# Patient Record
Sex: Female | Born: 1980 | Race: White | Hispanic: Yes | Marital: Married | Smoking: Never smoker
Health system: Southern US, Community
[De-identification: ages and names within clinical notes are randomized; demographics above are authoritative.]

---

## 2006-06-19 ENCOUNTER — Emergency Department: Payer: Self-pay | Admitting: Emergency Medicine

## 2006-06-19 ENCOUNTER — Other Ambulatory Visit: Payer: Self-pay

## 2009-06-24 ENCOUNTER — Emergency Department: Payer: Self-pay | Admitting: Emergency Medicine

## 2009-06-27 ENCOUNTER — Emergency Department: Payer: Self-pay | Admitting: Emergency Medicine

## 2011-04-21 ENCOUNTER — Emergency Department: Payer: Self-pay | Admitting: Emergency Medicine

## 2011-11-21 ENCOUNTER — Ambulatory Visit: Payer: Self-pay | Admitting: Family Medicine

## 2012-02-01 ENCOUNTER — Observation Stay: Payer: Self-pay | Admitting: Obstetrics and Gynecology

## 2012-02-01 LAB — URINALYSIS, COMPLETE
Bilirubin,UR: NEGATIVE
Glucose,UR: NEGATIVE mg/dL (ref 0–75)
Nitrite: NEGATIVE
Ph: 7 (ref 4.5–8.0)
Protein: NEGATIVE
RBC,UR: <1 /HPF (ref 0–5)
Squamous Epithelial: NONE SEEN
WBC UR: 4 /HPF (ref 0–5)

## 2012-02-01 LAB — PREGNANCY, URINE: Pregnancy Test, Urine: POSITIVE m[IU]/mL

## 2012-05-17 ENCOUNTER — Inpatient Hospital Stay: Payer: Self-pay | Admitting: Obstetrics and Gynecology

## 2012-05-17 LAB — CBC WITH DIFFERENTIAL/PLATELET
Eosinophil #: 0.1 10*3/uL (ref 0.0–0.7)
Eosinophil %: 1.2 %
HCT: 36.7 % (ref 35.0–47.0)
Lymphocyte #: 2.2 10*3/uL (ref 1.0–3.6)
Lymphocyte %: 28.7 %
MCHC: 34.1 g/dL (ref 32.0–36.0)
MCV: 89 fL (ref 80–100)
Monocyte %: 8.2 %
Neutrophil #: 4.6 10*3/uL (ref 1.4–6.5)
Neutrophil %: 60.5 %
RBC: 4.12 10*6/uL (ref 3.80–5.20)
WBC: 7.6 10*3/uL (ref 3.6–11.0)

## 2014-06-02 ENCOUNTER — Ambulatory Visit: Payer: Self-pay | Admitting: Advanced Practice Midwife

## 2014-07-07 ENCOUNTER — Ambulatory Visit: Payer: Self-pay | Admitting: Physician Assistant

## 2014-09-21 ENCOUNTER — Observation Stay: Payer: Self-pay | Admitting: Obstetrics and Gynecology

## 2014-11-16 ENCOUNTER — Observation Stay: Payer: Self-pay | Admitting: Obstetrics and Gynecology

## 2014-11-21 ENCOUNTER — Inpatient Hospital Stay: Payer: Self-pay | Admitting: Obstetrics and Gynecology

## 2015-02-13 NOTE — H&P (Signed)
L&D Evaluation:  History:   HPI 34 y/o G3P2002 @ 40/3wks EDC 05/13/12 arrives with c/o strong contractions, denies leaking fluid or vaginal bleeding. Baby moves well. PNC at [facility] (false + HIV repeat negative) GBS+    Presents with contractions    Patient's Medical History No Chronic Illness    Patient's Surgical History none    Medications Pre Natal Vitamins    Allergies NKDA    Social History none    Family History Non-Contributory   ROS:   ROS All systems were reviewed.  HEENT, CNS, GI, GU, Respiratory, CV, Renal and Musculoskeletal systems were found to be normal.   Exam:   Vital Signs stable    Urine Protein not completed    General no apparent distress    Mental Status clear    Chest clear    Heart normal sinus rhythm    Abdomen gravid, non-tender    Estimated Fetal Weight Average for gestational age    Fetal Position vtx    Fundal Height term    Back no CVAT    Edema no edema    Reflexes 1+    Clonus negative    Pelvic no external lesions, 5cm upon admission 7cm 100% vtx @ -1 BOWI sm show    Mebranes Intact    FHT normal rate with no decels, baseline 130's 140's avg variability with accels    Fetal Heart Rate 134    Ucx regular, q 3 mins 60 sec strong    Skin dry    Lymph no lymphadenopathy   Impression:   Impression active labor   Plan:   Plan monitor contractions and for cervical change, antibiotics for GBBS prophylaxis    Comments Breathing well thru uc's. Declines pain meds, IV ABX begun. Knows what to expect 3rd baby. Family supportive, at bedside.   Electronic Signatures: Albertina ParrLugiano, Jailene Cupit B (CNM)  (Signed 12-Aug-13 02:59)  Authored: L&D Evaluation   Last Updated: 12-Aug-13 02:59 by Albertina ParrLugiano, Ludie Pavlik B (CNM)

## 2015-03-08 ENCOUNTER — Encounter: Payer: Self-pay | Admitting: Emergency Medicine

## 2015-03-08 ENCOUNTER — Emergency Department
Admission: EM | Admit: 2015-03-08 | Discharge: 2015-03-08 | Disposition: A | Discharge status: Home / Self Care | Payer: Self-pay | Attending: Emergency Medicine | Admitting: Emergency Medicine

## 2015-03-08 DIAGNOSIS — G43009 Migraine without aura, not intractable, without status migrainosus: Secondary | ICD-10-CM | POA: Insufficient documentation

## 2015-03-08 LAB — BASIC METABOLIC PANEL
Anion gap: 6 (ref 5–15)
BUN: 9 mg/dL (ref 6–20)
CHLORIDE: 109 mmol/L (ref 101–111)
CO2: 25 mmol/L (ref 22–32)
CREATININE: 0.65 mg/dL (ref 0.44–1.00)
Calcium: 8.6 mg/dL — ABNORMAL LOW (ref 8.9–10.3)
GFR calc non Af Amer: >60 mL/min (ref >60)
Glucose, Bld: 91 mg/dL (ref 65–99)
Potassium: 3.8 mmol/L (ref 3.5–5.1)
Sodium: 140 mmol/L (ref 135–145)

## 2015-03-08 LAB — CBC WITH DIFFERENTIAL/PLATELET
Basophils Absolute: 0.1 10*3/uL (ref 0–0.1)
Basophils Relative: 1 %
Eosinophils Absolute: 0.2 10*3/uL (ref 0–0.7)
Eosinophils Relative: 2 %
HCT: 38.9 % (ref 35.0–47.0)
Hemoglobin: 12.7 g/dL (ref 12.0–16.0)
Lymphocytes Relative: 26 %
Lymphs Abs: 1.9 10*3/uL (ref 1.0–3.6)
MCH: 29 pg (ref 26.0–34.0)
MCHC: 32.5 g/dL (ref 32.0–36.0)
MCV: 89.1 fL (ref 80.0–100.0)
Monocytes Absolute: 0.5 10*3/uL (ref 0.2–0.9)
Monocytes Relative: 7 %
Neutro Abs: 4.6 10*3/uL (ref 1.4–6.5)
Neutrophils Relative %: 64 %
PLATELETS: 218 10*3/uL (ref 150–440)
RBC: 4.37 MIL/uL (ref 3.80–5.20)
RDW: 13.2 % (ref 11.5–14.5)
WBC: 7.3 10*3/uL (ref 3.6–11.0)

## 2015-03-08 MED ORDER — BUTALBITAL-APAP-CAFFEINE 50-325-40 MG PO TABS
ORAL_TABLET | ORAL | Status: AC
  Administered 2015-03-08: 2 via ORAL
  Filled 2015-03-08: qty 2

## 2015-03-08 MED ORDER — BUTALBITAL-APAP-CAFFEINE 50-325-40 MG PO TABS
2.0000 | ORAL_TABLET | ORAL | Status: AC
  Administered 2015-03-08: 2 via ORAL

## 2015-03-08 NOTE — ED Notes (Signed)
C/o right sided head pain and numbness with nausea x 3 days, states she had the same numbness about 2 weeks ago that resolved, also states she has some dizziness, denies any hx of migraines

## 2015-03-08 NOTE — ED Notes (Signed)
Pt alert and oriented X4, active, cooperative, pt in NAD. RR even and unlabored, color WNL.  Pt informed to return if any life threatening symptoms occur.   

## 2015-03-08 NOTE — ED Notes (Signed)
R sided headache X 3 days, nausea. Pt alert and oriented X4, active, cooperative, pt in NAD. RR even and unlabored, color WNL.

## 2015-03-08 NOTE — ED Notes (Signed)
Awaiting interpreter for discharge.  

## 2015-03-08 NOTE — Discharge Instructions (Signed)
Cefalea migrañosa °(Migraine Headache) °Una cefalea migrañosa es un dolor intenso y punzante en uno o ambos lados de la cabeza. La migraña puede durar desde 30 minutos hasta varias horas. °CAUSAS  °No siempre se conoce la causa exacta de la cefalea migrañosa. Sin embargo, pueden aparecer cuando los nervios del cerebro se irritan y liberan ciertas sustancias químicas que causan inflamación. Esto ocasiona dolor. °Existen también ciertos factores que pueden desencadenar las migrañas, como los siguientes: °· Alcohol. °· Fumar. °· Estrés. °· La menstruación °· Quesos añejados. °· Los alimentos o las bebidas que contienen nitratos, glutamato, aspartamo o tiramina. °· Falta de sueño. °· Chocolate. °· Cafeína. °· Hambre. °· Actividad física extenuante. °· Fatiga. °· Medicamentos que se usan para tratar el dolor en el pecho (nitroglicerina), píldoras anticonceptivas, estrógeno y algunos medicamentos para la hipertensión arterial. °SIGNOS Y SÍNTOMAS °· Dolor en uno o ambos lados de la cabeza. °· Dolor pulsante o punzante. °· Dolor intenso que impide realizar las actividades diarias. °· Dolor que se agrava por cualquier actividad física. °· Náuseas, vómitos o ambos. °· Mareos. °· Dolor con la exposición a las luces brillantes, a los ruidos fuertes o la actividad. °· Sensibilidad general a las luces brillantes, a los ruidos fuertes o a los olores. °Antes de sufrir una migraña, puede recibir señales de advertencia (aura). Un aura puede incluir: °· Ver las luces intermitentes. °· Ver puntos brillantes, halos o líneas en zigzag. °· Tener una visión en túnel o visión borrosa. °· Sensación de entumecimiento u hormigueo. °· Dificultad para hablar °· Debilidad muscular. °DIAGNÓSTICO  °La cefalea migrañosa se diagnostica en función de lo siguiente: °· Síntomas. °· Examen físico. °· Una TC (tomografía computada) o resonancia magnética de la cabeza. Estas pruebas de diagnóstico por imagen no pueden diagnosticar las migrañas, pero pueden  ayudar a descartar otras causas de las cefaleas. °TRATAMIENTO °Le prescribirán medicamentos para aliviar el dolor y las náuseas. También podrán administrarse medicamentos para ayudar a prevenir las migrañas recurrentes.  °INSTRUCCIONES PARA EL CUIDADO EN EL HOGAR °· Sólo tome medicamentos de venta libre o recetados para calmar el dolor o el malestar, según las indicaciones de su médico. No se recomienda usar los opiáceos a largo plazo. °· Cuando tenga la migraña, acuéstese en un cuarto oscuro y tranquilo °· Lleve un registro diario para averiguar lo que puede provocar las cefaleas migrañosas. Por ejemplo, escriba: °¨ Lo que usted come y bebe. °¨ Cuánto tiempo duerme. °¨ Algún cambio en su dieta o en los medicamentos. °· Limite el consumo de bebidas alcohólicas. °· Si fuma, deje de hacerlo. °· Duerma entre 7 y 9 horas, o según las recomendaciones del médico. °· Limite el estrés. °· Mantenga las luces tenues si le molestan las luces brillantes y la migraña empeora. °SOLICITE ATENCIÓN MÉDICA DE INMEDIATO SI:  °· La migraña se hace cada vez más intensa. °· Tiene fiebre. °· Presenta rigidez en el cuello. °· Tiene pérdida de visión. °· Presenta debilidad muscular o pérdida del control muscular. °· Comienza a perder el equilibrio o tiene problemas para caminar. °· Sufre mareos o se desmaya. °· Tiene síntomas graves que son diferentes a los primeros síntomas. °ASEGÚRESE DE QUE:  °· Comprende estas instrucciones. °· Controlará su afección. °· Recibirá ayuda de inmediato si no mejora o si empeora. °Document Released: 09/22/2005 Document Revised: 07/13/2013 °ExitCare® Patient Information ©2015 Exit[employer]. This information is not intended to replace advice given to you by your health care provider. Make sure you discuss any questions you have with your   health care provider. ° ° °

## 2015-03-08 NOTE — ED Provider Notes (Signed)
[facility] Emergency Department Provider Note  ____________________________________________  Time seen: Approximately 4:18 PM  I have reviewed the triage vital signs and the nursing notes.   HISTORY  Chief Complaint Headache and Nausea  The patient is Spanish-speaking and I offered the use of a hospital interpreter but she prefers to speak Spanish struck with me.  Her daughter who is fluent in [place] was also present at the time.  HPI Leslie Sosa is a 34 y.o. female with no significant past medical history presents with 3 days of right-sided unilateral headache that she describes as throbbing and constant.  It is accompanied with nausea.  Nothing makes it better and nothing makes it worse.  She describes it as mild to moderate.  She does not have extensive symptoms in the past.  She has no numbness or weakness in any of her extremities.  She has not tried taking any over-the-counter medicine for it.  Of note she is currently breast-feeding.She tried to set up an appointment at [facility] but will be approximately 2 months before she can have that appointment.   History reviewed. No pertinent past medical history.  There are no active problems to display for this patient.   History reviewed. No pertinent past surgical history.  No current outpatient prescriptions on file.  Allergies Review of patient's allergies indicates no known allergies.  No family history on file.  Social History History  Substance Use Topics  . Smoking status: Never Smoker   . Smokeless tobacco: Not on file  . Alcohol Use: No    Review of Systems Constitutional: No fever/chills Eyes: No visual changes. ENT: No sore throat. Cardiovascular: Denies chest pain. Respiratory: Denies shortness of breath. Gastrointestinal: No abdominal pain.  nausea, no vomiting.  No diarrhea.  No constipation. Genitourinary: Negative for dysuria. Musculoskeletal: Negative  for back pain. Skin: Negative for rash. Neurological: Headache as described above with no focal weakness or numbness  10-point ROS otherwise negative.  ____________________________________________   PHYSICAL EXAM:  VITAL SIGNS: ED Triage Vitals  Enc Vitals Group     BP 03/08/15 1449 134/79 mmHg     Pulse Rate 03/08/15 1449 85     Resp 03/08/15 1449 18     Temp 03/08/15 1449 99.2 F (37.3 C)     Temp Source 03/08/15 1449 Oral     SpO2 03/08/15 1449 98 %     Weight 03/08/15 1449 175 lb (79.379 kg)     Height 03/08/15 1449  (1.626 m)     Head Cir --      Peak Flow --      Pain Score 03/08/15 1450 8     Pain Loc --      Pain Edu? --      Excl. in GC? --     Constitutional: Alert and oriented. Well appearing and in no acute distress. Eyes: Conjunctivae are normal. PERRL. EOMI. normal funduscopic exam. Head: Atraumatic. Nose: No congestion/rhinnorhea. Mouth/Throat: Mucous membranes are moist.  Oropharynx non-erythematous. Neck: No stridor.  No cervical spine tenderness to palpation. Cardiovascular: Normal rate, regular rhythm. Grossly normal heart sounds.  Good peripheral circulation. Respiratory: Normal respiratory effort.  No retractions. Lungs CTAB. Gastrointestinal: Soft and nontender. No distention. No abdominal bruits. No CVA tenderness. Musculoskeletal: No lower extremity tenderness nor edema.  No joint effusions. Neurologic:  Normal speech and language. No gross focal neurologic deficits are appreciated. Speech is normal. No gait instability. Skin:  Skin is warm, dry and  intact. No rash noted. Psychiatric: Mood and affect are normal. Speech and behavior are normal.  ____________________________________________   LABS (all labs ordered are listed, but only abnormal results are displayed)  Labs Reviewed  BASIC METABOLIC PANEL - Abnormal; Notable for the following:    Calcium 8.6 (*)    All other components within normal limits  CBC WITH DIFFERENTIAL/PLATELET    ____________________________________________  EKG  Not indicated ____________________________________________  RADIOLOGY  Not indicated  ____________________________________________  INITIAL IMPRESSION / ASSESSMENT AND PLAN / ED COURSE  Pertinent labs & imaging results that were available during my care of the patient were reviewed by me and considered in my medical decision making (see chart for details).  The patient is well-appearing and in no acute distress.  Her signs and symptoms are most consistent with a migraine.  I offered IV treatment with the usual IV migraine cocktail, but she and her family need to leave to go to her son's graduation.  She prefers a dose of medicine by pills.  I discussed the use of Fioricet with the pharmacist who stated it is a class C medication and that a single dose is unlikely to cause any issues while breast-feeding.  I will not prescribe any medication and instead recommended that she take over-the-counter Tylenol according to label instructions and will give her a one-time dose of Fioricet here.  I gave her and her daughter my usual and customary return precautions.  ____________________________________________  FINAL CLINICAL IMPRESSION(S) / ED DIAGNOSES  Final diagnoses:  Migraine without aura and without status migrainosus, not intractable      NEW MEDICATIONS STARTED DURING THIS VISIT:  New Prescriptions   No medications on file     Loleta Roseory Katalin Colledge, MD 03/08/15 1623

## 2015-03-08 NOTE — ED Notes (Signed)
Interpreter at bedside for discharge.

## 2015-03-13 ENCOUNTER — Emergency Department
Admission: EM | Admit: 2015-03-13 | Discharge: 2015-03-13 | Discharge status: Against Medical Advice | Payer: Self-pay | Attending: Emergency Medicine | Admitting: Emergency Medicine

## 2015-03-13 ENCOUNTER — Encounter: Payer: Self-pay | Admitting: *Deleted

## 2015-03-13 DIAGNOSIS — G43909 Migraine, unspecified, not intractable, without status migrainosus: Secondary | ICD-10-CM | POA: Insufficient documentation

## 2015-03-13 NOTE — ED Notes (Signed)
Pt was seen Friday for migraine, pt left before receiving medications, pt returns with migraine today

## 2015-03-16 ENCOUNTER — Telehealth: Payer: Self-pay | Admitting: Emergency Medicine

## 2015-03-16 NOTE — ED Notes (Signed)
Called patient due to lwot to inquire about condition and follow up plans. Left message with my number. 

## 2016-04-26 ENCOUNTER — Encounter: Payer: Self-pay | Admitting: Urgent Care

## 2016-04-26 ENCOUNTER — Emergency Department
Admission: EM | Admit: 2016-04-26 | Discharge: 2016-04-26 | Disposition: A | Discharge status: Home / Self Care | Payer: Medicaid Other | Attending: Emergency Medicine | Admitting: Emergency Medicine

## 2016-04-26 DIAGNOSIS — R609 Edema, unspecified: Secondary | ICD-10-CM | POA: Insufficient documentation

## 2016-04-26 DIAGNOSIS — T63441A Toxic effect of venom of bees, accidental (unintentional), initial encounter: Secondary | ICD-10-CM | POA: Insufficient documentation

## 2016-04-26 MED ORDER — PREDNISONE 10 MG (21) PO TBPK: ORAL_TABLET | ORAL | Status: AC

## 2016-04-26 MED ORDER — PREDNISONE 20 MG PO TABS
ORAL_TABLET | ORAL | Status: AC
  Administered 2016-04-26: 60 mg via ORAL
  Filled 2016-04-26: qty 3

## 2016-04-26 MED ORDER — PREDNISONE 20 MG PO TABS
60.0000 mg | ORAL_TABLET | Freq: Once | ORAL | Status: AC
  Administered 2016-04-26: 60 mg via ORAL

## 2016-04-26 MED ORDER — EPINEPHRINE 0.3 MG/0.3ML IJ SOAJ: 0.3000 mg | Freq: Once | INTRAMUSCULAR | Status: AC

## 2016-04-26 NOTE — ED Notes (Signed)
AAOx3.  Skin warm and dry.  NAD 

## 2016-04-26 NOTE — Discharge Instructions (Signed)
Picadura de [place], avispa o avispn ([place], [place], or [employer]) [place], las avispas y los avispones forman parte de una familia de insectos que pueden Engineer, agricultural a las Engineer, manufacturing. Estas picaduras causan dolor e inflamacin, pero por lo general no son graves. Sin embargo, Psychologist, clinical pueden tener una reaccin alrgica a Venda [place]odes. Esto puede agravar los sntomas.  SNTOMAS  Los sntomas frecuentes de este proceso [employer] siguientes:   Un bulto rojo en la piel que a veces tiene un Animal nutritionist. En algunos casos, puede haber un aguijn en el centro de la herida.  Dolor y Futures trader de la picadura.  Enrojecimiento e hinchazn alrededor del lugar de la picadura. Si tiene Nurse, mental health (reaccin alrgica localizada), la hinchazn y el enrojecimiento pueden extenderse ms all del lugar de la picadura. En algunos casos, esta reaccin puede seguir avanzando en las siguientes 12 a 36horas. En contadas ocasiones, una persona puede tener una reaccin alrgica grave (reaccin anafilctica) a Venda Rodes. Los sntomas de una reaccin anafilctica pueden incluir lo siguiente:   Dificultad para respirar o sibilancias.  Manchas rojas elevadas en la piel que causan picazn.  Nuseas o vmitos.  Clicos abdominales.  Diarrea.  Dolor en el pecho.  Desmayos.  Enrojecimiento del rostro (rubor). DIAGNSTICO  Este proceso suele diagnosticarse en funcin de los sntomas, la historia clnica y un examen fsico. TRATAMIENTO  La mayora de las picaduras pueden tratarse con lo siguiente:   Hielo para reducir la hinchazn.  Medicamentos (antihistamnicos) para tratar la picazn o Nurse, mental health.  Medicamentos para ayudar a Best boy. Estos pueden ser medicamentos que se toman por va oral, o cremas o lociones medicinales que se aplican en la piel. Si lo pic una abeja, el insecto puede depositar el aguijn y un pequeo saco de veneno en la herida.  El aguijn y el saco de veneno se pueden quitar si se raspa el lugar de la picadura con una tarjeta plana, por ejemplo, una tarjeta de crdito. Otro mtodo consiste en pellizcar la zona y Environmental manager. Estos mtodos pueden ayudar a Education officer, museum gravedad de la reaccin del cuerpo a la picadura.  [place] lugar de la picadura todos los das con agua y Manufacturing engineer se lo haya indicado el mdico.  Aplquese o tome los medicamentos de venta libre y los recetados solamente como se lo haya indicado el mdico.  Si se lo indican, aplique hielo sobre la zona de la picadura.  Ponga el hielo en una bolsa plstica.  Coloque una toalla entre la piel y la bolsa de hielo.  Coloque el hielo durante 81mnutos, 2 a 3veces por dTraining and development officer  No se rasque la zona de la picadura.  Para aliviar el dolor, pruebe con una pasta que se prepara con agua y bicarbonato de sEads aplquesela sobre la picadura. Frote la pasta sobre la zona de la picadura y djela durante 522mutos.  Si tuvo una reaccin alrgica grave a una picadura, tal vez tenga que hacer lo siguiente:  UtRisk managern brazalete o un collar de alerta mdico que indique la alBuyer, retail Saber cundo y cmo usar un kit de anafilaxia o una inyeccin de epinefrina. Los miUnitedHealthe su familia tambin deben teBest boyonocimientos al reSears Holdings Corporation Llevar con usted un kit de anafilaxia en todo momento. SOLICITE ATENCIN MDICA SI:   Los sntomas no mejoran en el trmino de 2 o 3das.  Tiene  enrojecimiento, hinchazn o dolor que se extienden ms all de la zona de la picadura.  Tiene fiebre. SOLICITE ATENCIN MDICA DE INMEDIATO SI:  Tiene sntomas de una reaccin alrgica grave. Estos incluyen los siguientes:   Dificultad para respirar o sibilancias.  Dolor en el pecho.  Sensacin de desvanecimiento o desmayos.  Manchas rojas elevadas en la piel que le causan picazn.  Nuseas o vmitos.  Clicos  abdominales.  Diarrea.   Esta informacin no tiene Marine scientist el consejo del mdico. Asegrese de hacerle al mdico cualquier pregunta que tenga.   Document Released: 09/22/2005 Document Revised: 06/13/2015 Elsevier Interactive Patient Education [employer].

## 2016-04-26 NOTE — ED Notes (Addendum)
Patient presents with c/o swelling to RIGHT hand s/p being stung by a bee. No respiratory involvement. Will need interpreter.

## 2016-04-26 NOTE — ED Notes (Signed)
Right hand swelling, stung by bee PTA, no respiratory distress

## 2016-04-26 NOTE — ED Provider Notes (Signed)
[facility] Emergency Department Provider Note  ____________________________________________  Time seen: Approximately 9:35 PM   I have reviewed the triage vital signs and the nursing notes.   HISTORY  Chief Complaint Insect Bite   HPI Leslie Sosa is a 35 y.o. female presents to the emergency department for evaluation of left hand swelling and itching. She states that last night she was stung by a bee. She has taken allergy medication without any relief. She has had a similar episode in the past.  History reviewed. No pertinent past medical history.  There are no active problems to display for this patient.   History reviewed. No pertinent past surgical history.  Current Outpatient Rx  Name  Route  Sig  Dispense  Refill  . EPINEPHrine 0.3 mg/0.3 mL IJ SOAJ injection   Intramuscular   Inject 0.3 mLs (0.3 mg total) into the muscle once.   1 Device   1   . predniSONE (STERAPRED UNI-PAK 21 TAB) 10 MG (21) TBPK tablet      Take 6 tablets on day 1 Take 5 tablets on day 2 Take 4 tablets on day 3 Take 3 tablets on day 4 Take 2 tablets on day 5 Take 1 tablet on day 6   21 tablet   0     Allergies Review of patient's allergies indicates no known allergies.  No family history on file.  Social History Social History  Substance Use Topics  . Smoking status: Never Smoker   . Smokeless tobacco: None  . Alcohol Use: No    Review of Systems  Constitutional: Negative for fever/chills Respiratory: Negative for shortness of breath. Musculoskeletal: Negative for pain. Skin: Positive for left hand swelling and itching. Neurological: Negative for headaches, focal weakness or numbness. ____________________________________________   PHYSICAL EXAM:  VITAL SIGNS: ED Triage Vitals  Enc Vitals Group     BP 04/26/16 2104 143/83 mmHg     Pulse Rate 04/26/16 2104 98     Resp 04/26/16 2104 18     Temp 04/26/16 2104 98.6 F (37 C)      Temp src --      SpO2 04/26/16 2104 96 %     Weight 04/26/16 2104 178 lb (80.74 kg)     Height 04/26/16 2104 5\' 4"  (1.626 m)     Head Cir --      Peak Flow --      Pain Score 04/26/16 2105 5     Pain Loc --      Pain Edu? --      Excl. in GC? --      Constitutional: Alert and oriented. Well appearing and in no acute distress. Eyes: Conjunctivae are normal. EOMI. Nose: No congestion/rhinnorhea. Mouth/Throat: Mucous membranes are moist.   Neck: No stridor. Cardiovascular: Good peripheral circulation. Respiratory: Normal respiratory effort.  No retractions. Lungs clear to auscultation throughout. Musculoskeletal: FROM throughout. Neurologic:  Normal speech and language. No gross focal neurologic deficits are appreciated. Skin:  Left hand and fingers mildly edematous with mild erythema and excoriation. Unable to locate site of original sting.  ____________________________________________   LABS (all labs ordered are listed, but only abnormal results are displayed)  Labs Reviewed - No data to display ____________________________________________  EKG   ____________________________________________  RADIOLOGY   ____________________________________________   PROCEDURES  Procedure(s) performed: None ____________________________________________   INITIAL IMPRESSION / ASSESSMENT AND PLAN / ED COURSE  Pertinent labs & imaging results that were available during my care of the  patient were reviewed by me and considered in my medical decision making (see chart for details).  She will be advised to take the prednisone as prescribed and take Benadryl every 6 hours as needed for itching.  She was advised to follow up with primary care provider for choice for symptoms that are not improving over the next 2-3 days.  She was also advised to return to the emergency department for symptoms that change or worsen if unable to schedule an  appointment.  ____________________________________________   FINAL CLINICAL IMPRESSION(S) / ED DIAGNOSES  Final diagnoses:  Allergic reaction to bee sting, accidental or unintentional, initial encounter    Discharge Medication List as of 04/26/2016  9:56 PM    START taking these medications   Details  predniSONE (STERAPRED UNI-PAK 21 TAB) 10 MG (21) TBPK tablet Take 6 tablets on day 1 Take 5 tablets on day 2 Take 4 tablets on day 3 Take 3 tablets on day 4 Take 2 tablets on day 5 Take 1 tablet on day 6, Print         Chinita Pester, FNP 04/26/16 [id number]   Phineas Semen, MD 04/27/16 [phone]

## 2016-07-02 IMAGING — US US OB US >=[ID] SNGL FETUS
1 series · 13 of 28 positions shown · non-contrast
Comparison: none

CLINICAL DATA: Anatomic scan; no prior ultrasounds

EXAM:
ULTRASOUND OB >=M6QIH SINGLE FETUS

[Series 1: us ob us >=(id) sngl fetus · 0.24mm/px · 13 of 70 slices shown]
[im 3/70]
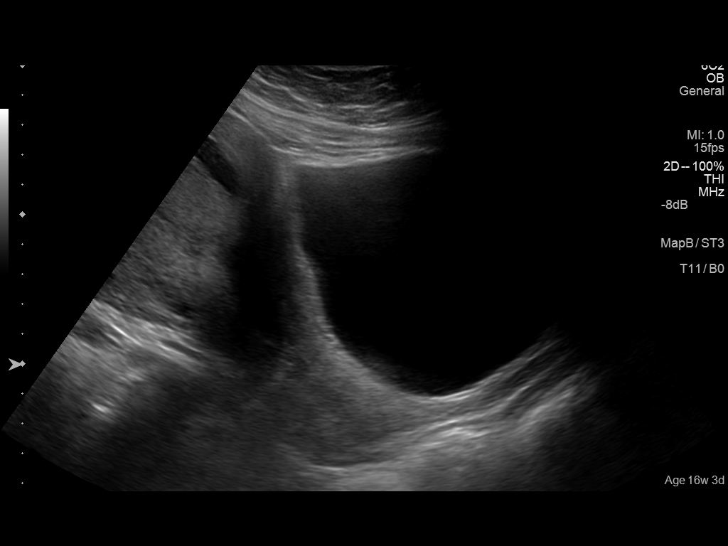
[im 8/70]
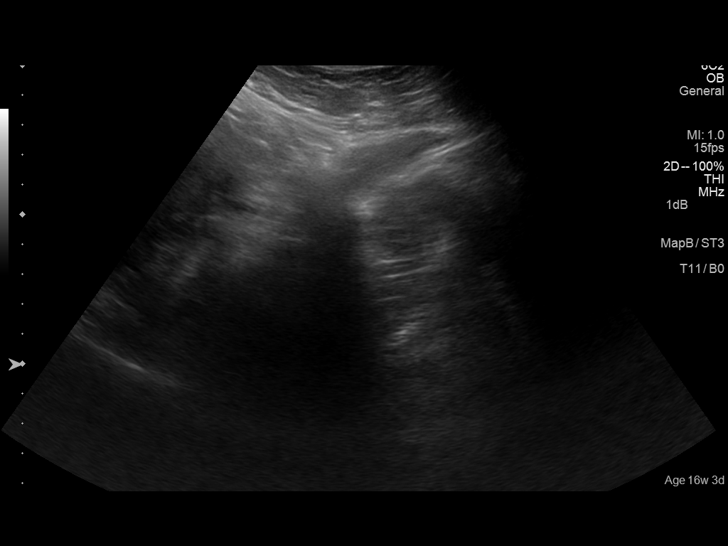
[im 13/70]
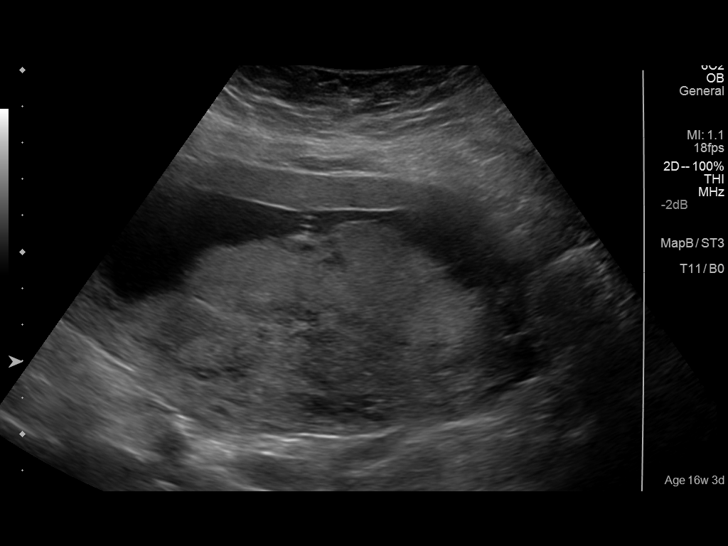
[im 18/70]
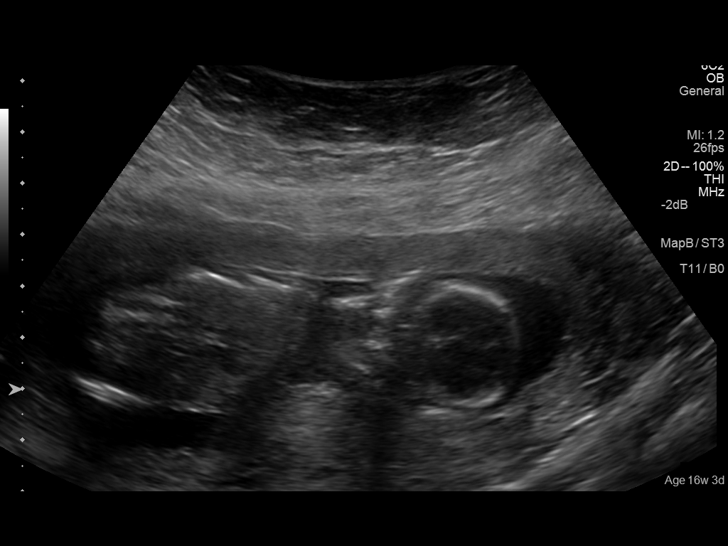
[im 24/70]
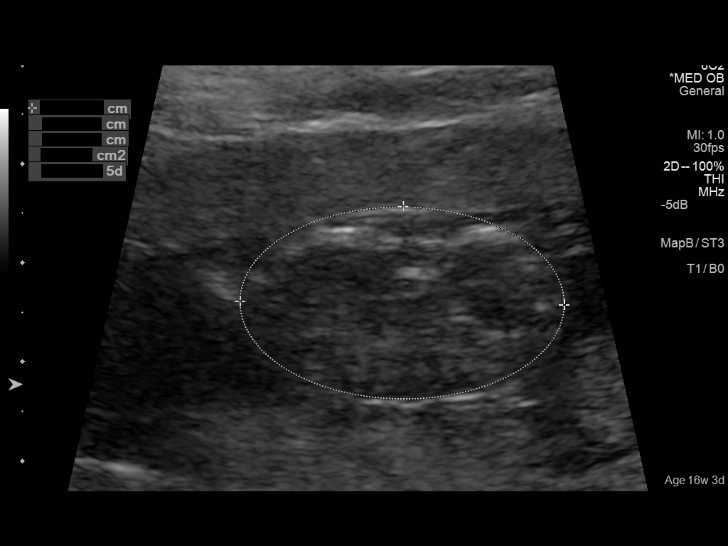
[im 29/70]
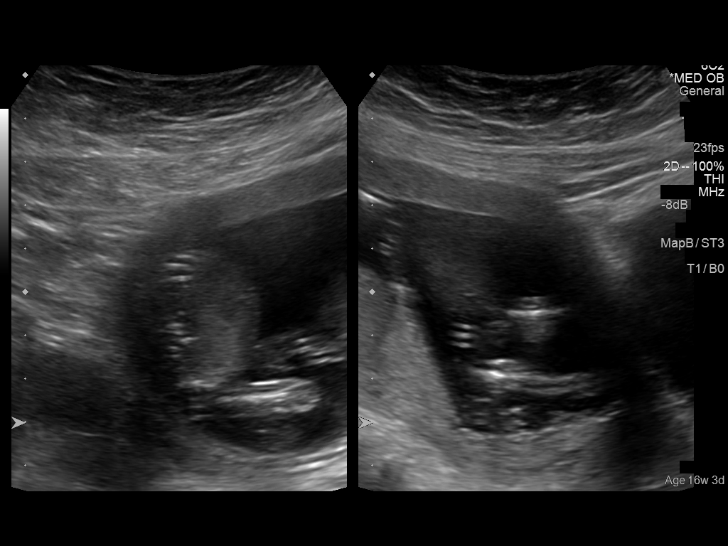
[im 36/70]
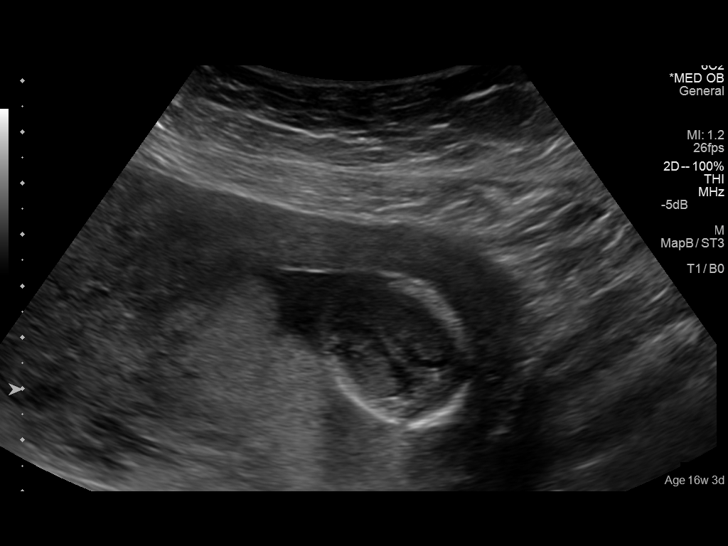
[im 41/70]
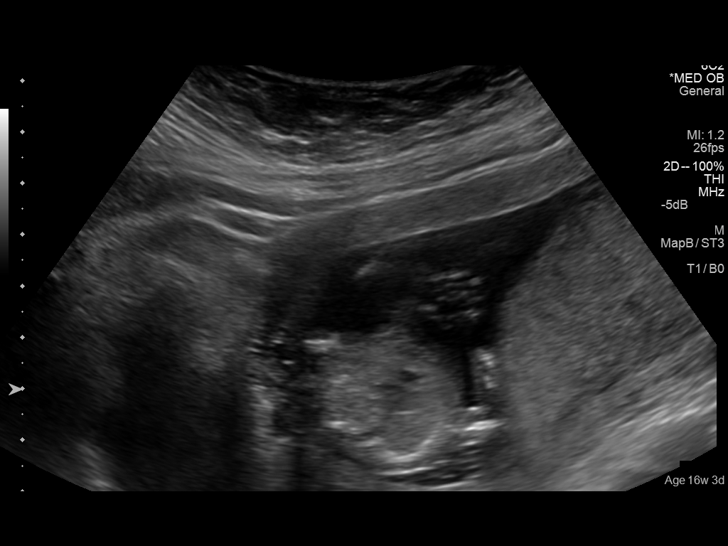
[im 47/70]
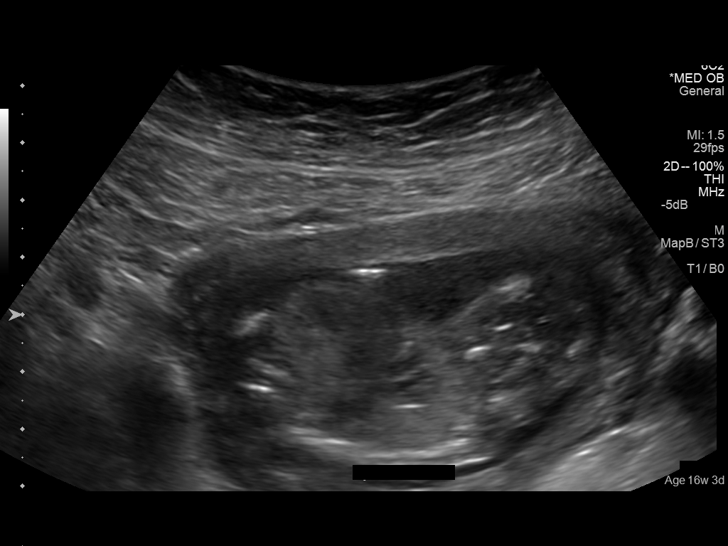
[im 52/70]
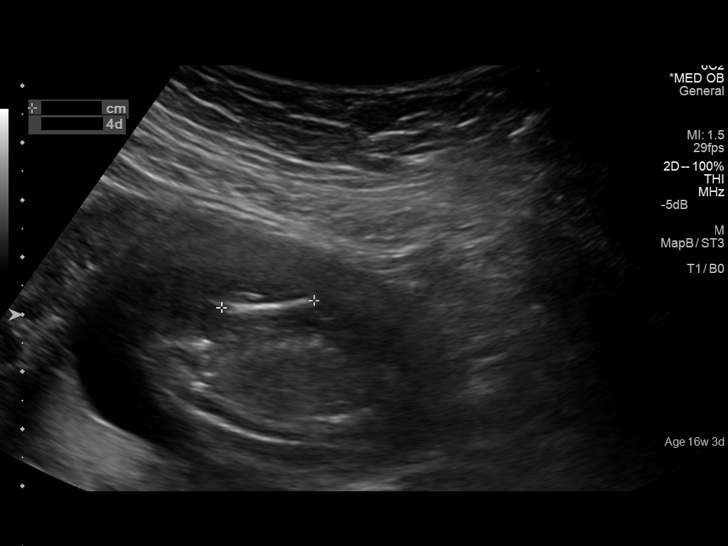
[im 57/70]
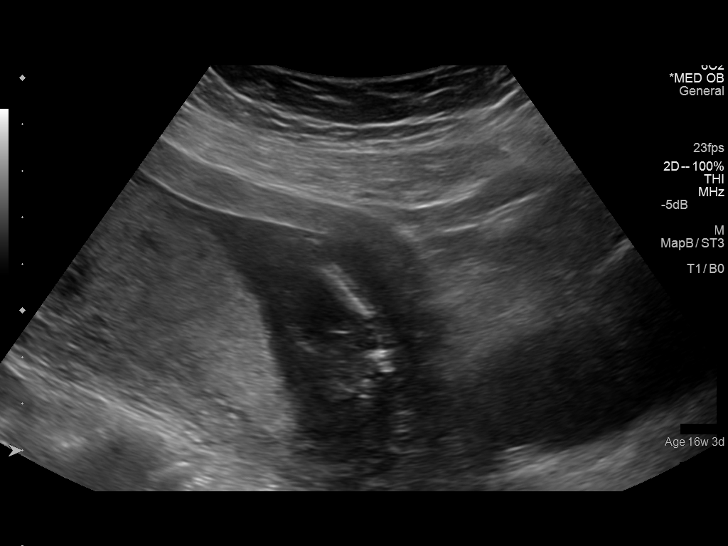
[im 62/70]
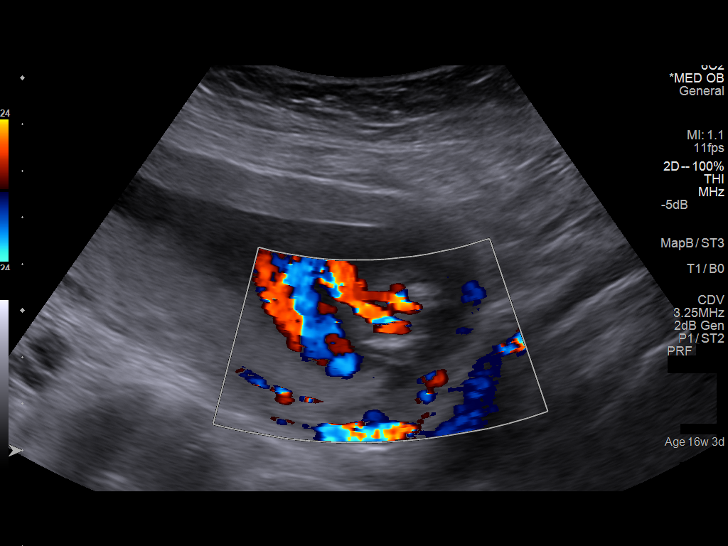
[im 67/70]
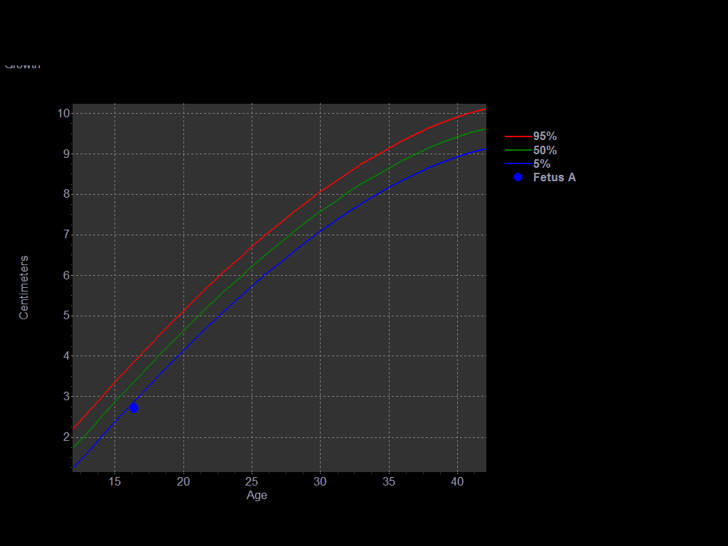

[13 of 28 positions shown; findings below may reference images not displayed]

FINDINGS: Number of Fetuses: 1

Heart Rate:  152 bpm

Movement: Present

Presentation: Variable

Previa: None ; distance from the lower placental segment to the
internal cervical os is 4.7 cm.

Placental Location: Posterior

Amniotic Fluid (Subjective): Normal

FETAL BIOMETRY

BPD:  2.71cm 14w 6d

HC:    10.45cm  15w   0d

AC:   8.6cm  14w   6d

FL:   1.86cm  15w   4d

Current Mean GA: 15w 0 d

US EDC: November 24, 2014

Assigned GA:  16w 3d based on menstrual dating

Assigned EDC:  November 14, 2014

Estimated Fetal Weight:  887grams 0lb 4oz  2%

FETAL ANATOMY

Lateral Ventricles: Not physeal lies

Thalami/CSP: Not visualized

Posterior Fossa:  Not visualized

Spine: Not visualized

Upper Lip: Not visualized

4 Chamber Heart on Left: Not visualized

Stomach on Left: Present

3 Vessel Cord: Present

Cord Insertion site: Normal

Kidneys: Not visualized

Bladder: Present

Extremities: Normal where visualized

Technically difficult due to: Early gestational age

Maternal Findings:

Cervix:  4.5 cm  cm  transabdominal in
IMPRESSION: There is a viable IUP with variable presentation and posterior
placenta. The estimated gestational age is 15 weeks 0 days with
estimated date of confinement 24 November, 2014. This is
approximately 10 days less mature than that predicted on clinical
grounds.

Evaluation of fetal anatomy was quite limited due to the early
gestational age. A follow-up scanning 20-22 weeks is recommended.

## 2016-08-06 IMAGING — US US OB US >=[ID] SNGL FETUS
1 series · 13 of 28 positions shown · non-contrast
Comparison: none

CLINICAL DATA: Anatomy scan

EXAM:
ULTRASOUND OB >=NG9J2 SINGLE FETUS

[Series 1: us ob us >=(id) sngl fetus · 0.26mm/px · 13 of 92 slices shown]
[im 4/92]
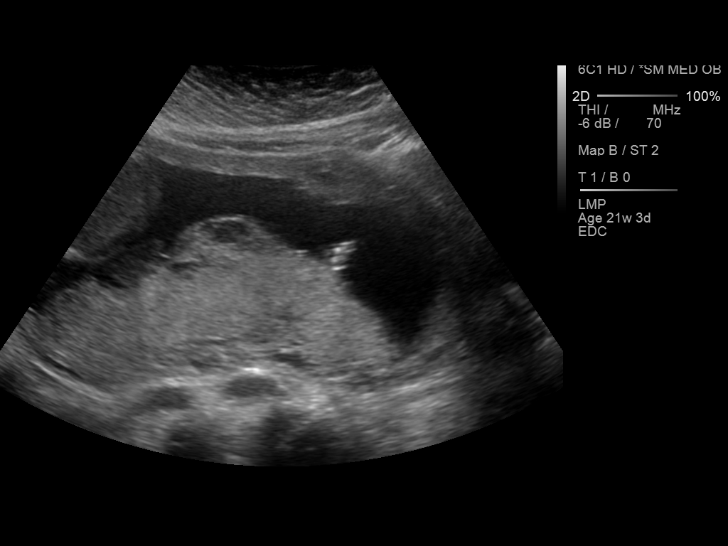
[im 11/92]
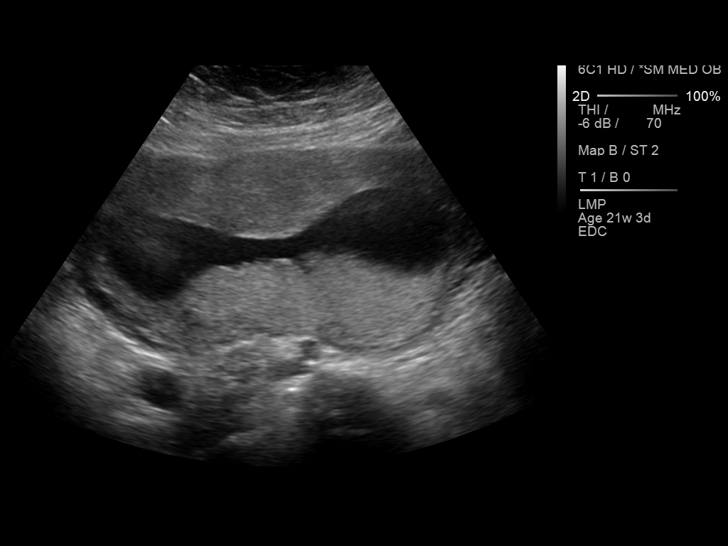
[im 17/92]
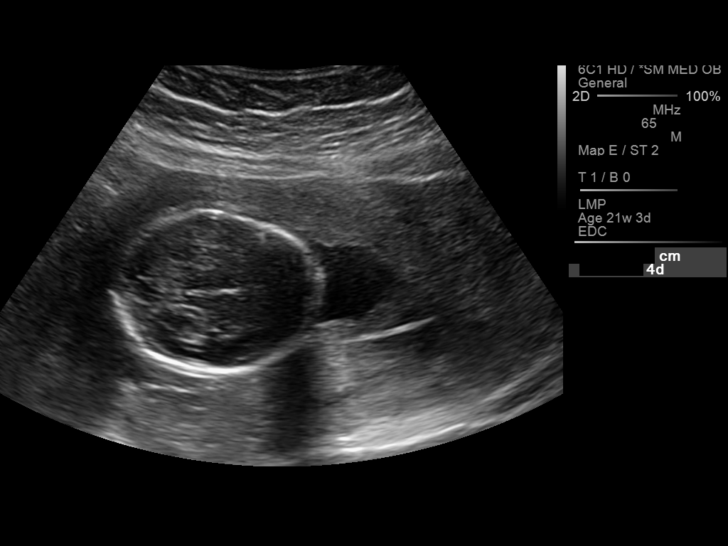
[im 24/92]
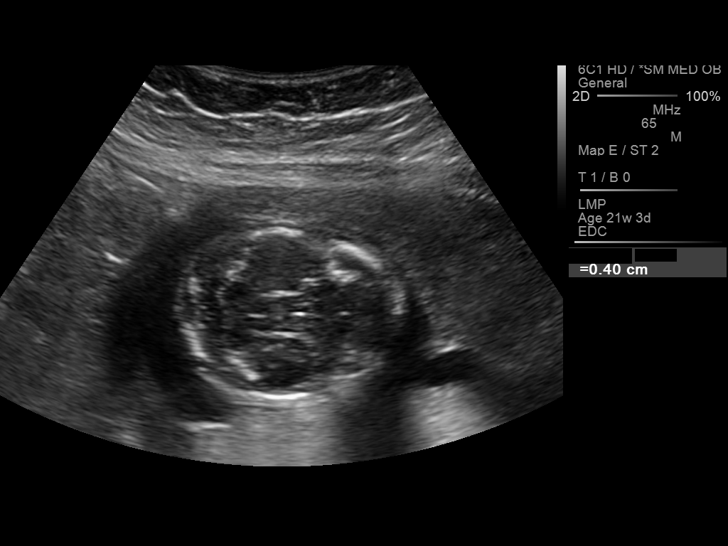
[im 31/92]
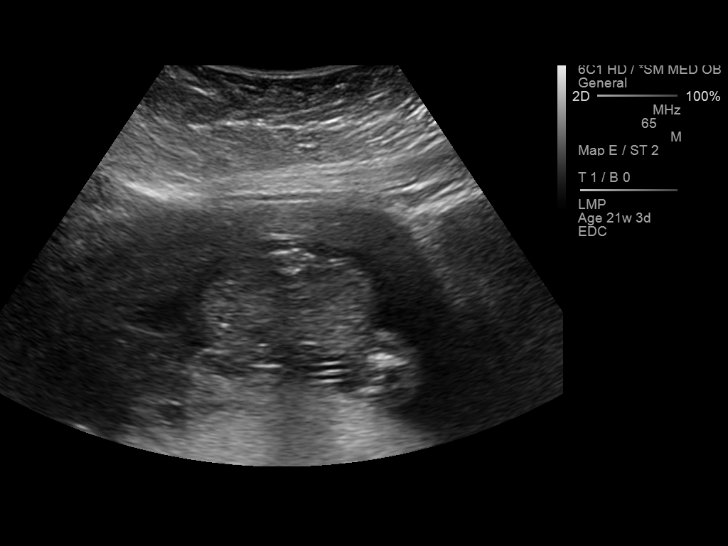
[im 38/92]
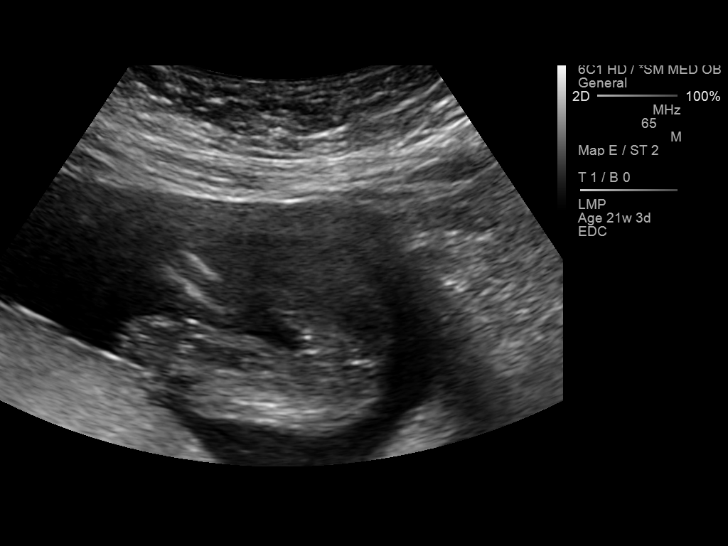
[im 48/92]
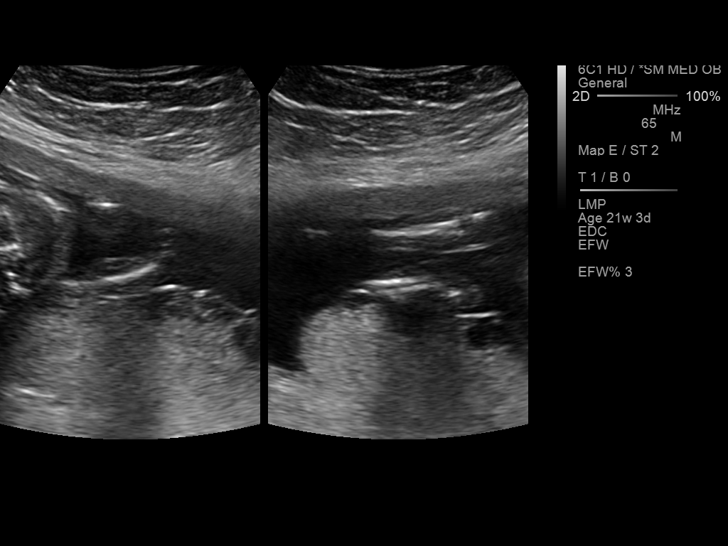
[im 54/92]
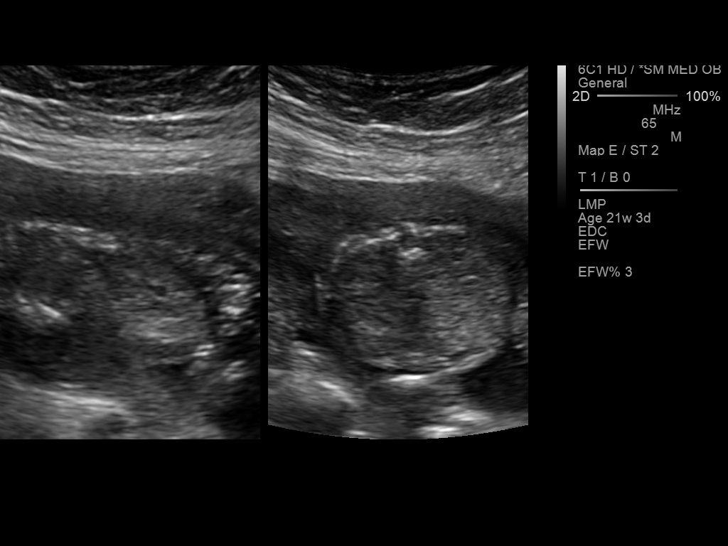
[im 61/92]
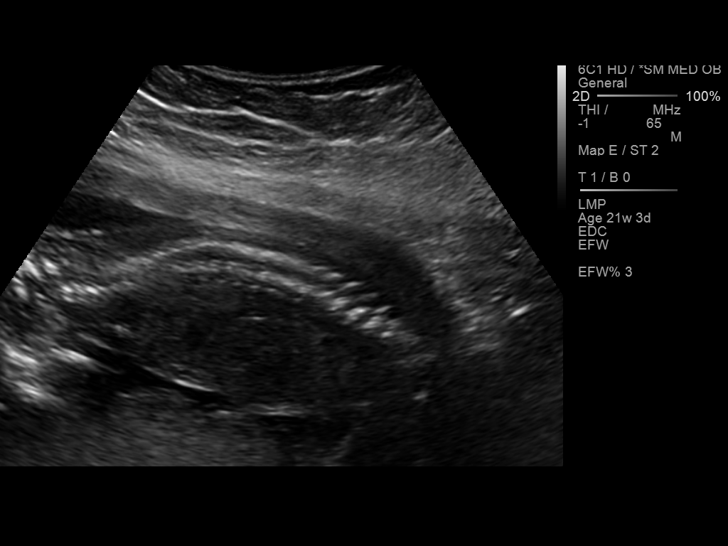
[im 68/92]
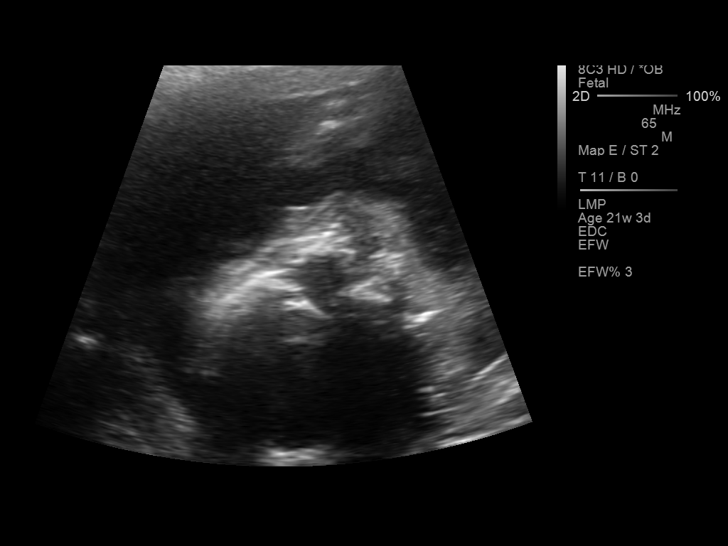
[im 75/92]
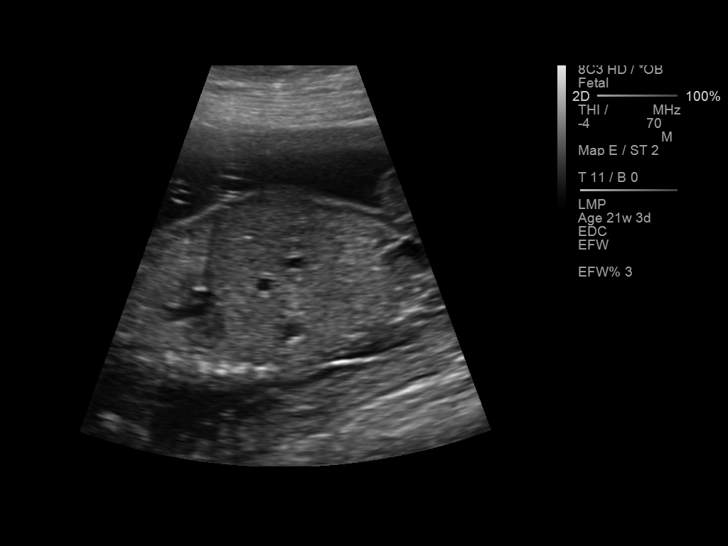
[im 81/92]
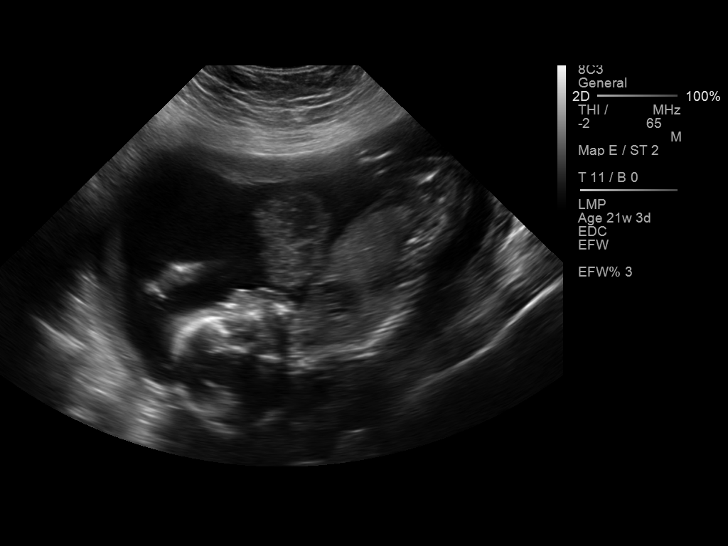
[im 88/92]
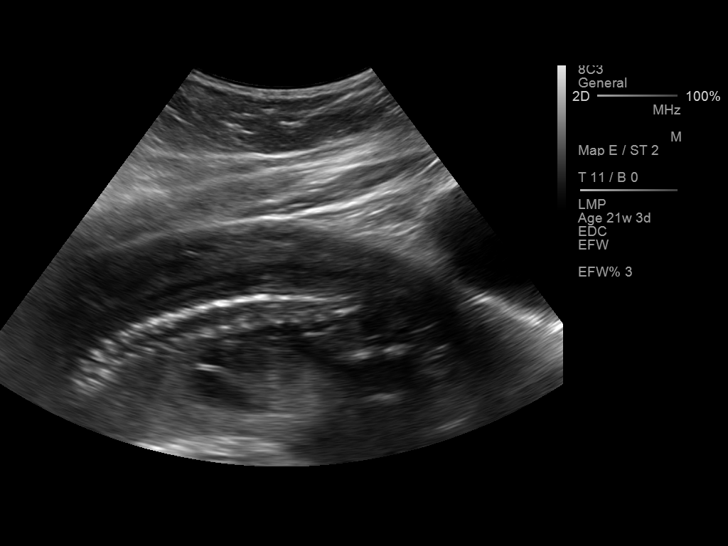

[13 of 28 positions shown; findings below may reference images not displayed]

FINDINGS: Number of Fetuses: 1

Heart Rate:  140 bpm

Movement: Present

Presentation: Breech and transverse

Previa: None

Placental Location: Posterior

Amniotic Fluid (Subjective): Normal

FETAL BIOMETRY

BPD:  4.5cm 19w 4d

HC:    17.2cm  19w   5d

AC:   15.1cm  20w   2d

FL:   3.2cm  19w   6d

Current Mean GA: 20w 0d

US EDC: 11/24/2014

Assigned GA:  21w 3d

Assigned EDC:  11/14/2013

Estimated Fetal Weight:  557grams 3% for 20 weeks

FETAL ANATOMY

Lateral Ventricles: Appears normal

Thalami/CSP: Appears normal

Posterior Fossa:  Appears normal

Nuchal Region: Appears normal    NFT= 4mm

Spine: Appears normal

Upper Lip: Appears normal

4 Chamber Heart on Left: Appears normal

Stomach on Left: Appears normal

3 Vessel Cord: Appears normal

Cord Insertion site: Appears normal

Kidneys: Appears normal

Bladder: Appears normal

Extremities: Appears normal

Additional Anatomy Visualized: None.

Technically difficult due to: Fetal position. Fetal profile, face
and heart are limited in evaluation secondary to fetal positioning.

Maternal Findings:

Cervix:  3.4  cm  closed
IMPRESSION: Single live intrauterine pregnancy as described above.

## 2017-03-01 ENCOUNTER — Emergency Department
Admission: EM | Admit: 2017-03-01 | Discharge: 2017-03-02 | Disposition: A | Discharge status: Home / Self Care | Payer: Self-pay | Attending: Emergency Medicine | Admitting: Emergency Medicine

## 2017-03-01 ENCOUNTER — Other Ambulatory Visit: Payer: Self-pay

## 2017-03-01 ENCOUNTER — Encounter: Payer: Self-pay | Admitting: Emergency Medicine

## 2017-03-01 DIAGNOSIS — R1010 Upper abdominal pain, unspecified: Secondary | ICD-10-CM | POA: Insufficient documentation

## 2017-03-01 DIAGNOSIS — R109 Unspecified abdominal pain: Secondary | ICD-10-CM

## 2017-03-01 LAB — CBC
HCT: 37.9 % (ref 35.0–47.0)
Hemoglobin: 12.9 g/dL (ref 12.0–16.0)
MCH: 30.2 pg (ref 26.0–34.0)
MCHC: 34 g/dL (ref 32.0–36.0)
MCV: 88.7 fL (ref 80.0–100.0)
PLATELETS: 201 10*3/uL (ref 150–440)
RBC: 4.27 MIL/uL (ref 3.80–5.20)
RDW: 13.4 % (ref 11.5–14.5)
WBC: 8.7 10*3/uL (ref 3.6–11.0)

## 2017-03-01 LAB — URINALYSIS, COMPLETE (UACMP) WITH MICROSCOPIC
BACTERIA UA: NONE SEEN
Bilirubin Urine: NEGATIVE
Glucose, UA: NEGATIVE mg/dL
Ketones, ur: 5 mg/dL — AB
LEUKOCYTES UA: NEGATIVE
Nitrite: NEGATIVE
Protein, ur: NEGATIVE mg/dL
SPECIFIC GRAVITY, URINE: 1.005 (ref 1.005–1.030)
pH: 7 (ref 5.0–8.0)

## 2017-03-01 LAB — COMPREHENSIVE METABOLIC PANEL
ALBUMIN: 4.2 g/dL (ref 3.5–5.0)
ALT: 19 U/L (ref 14–54)
AST: 19 U/L (ref 15–41)
Alkaline Phosphatase: 68 U/L (ref 38–126)
Anion gap: 10 (ref 5–15)
BUN: 11 mg/dL (ref 6–20)
CHLORIDE: 102 mmol/L (ref 101–111)
CO2: 24 mmol/L (ref 22–32)
CREATININE: 0.51 mg/dL (ref 0.44–1.00)
Calcium: 9.1 mg/dL (ref 8.9–10.3)
GFR calc Af Amer: >60 mL/min (ref >60)
GLUCOSE: 106 mg/dL — AB (ref 65–99)
Potassium: 3.5 mmol/L (ref 3.5–5.1)
Sodium: 136 mmol/L (ref 135–145)
Total Bilirubin: 0.9 mg/dL (ref 0.3–1.2)
Total Protein: 7.1 g/dL (ref 6.5–8.1)

## 2017-03-01 LAB — POCT PREGNANCY, URINE: PREG TEST UR: NEGATIVE

## 2017-03-01 LAB — LIPASE, BLOOD: LIPASE: 22 U/L (ref 11–51)

## 2017-03-01 LAB — TROPONIN I

## 2017-03-01 MED ORDER — ONDANSETRON HCL 4 MG/2ML IJ SOLN
4.0000 mg | Freq: Once | INTRAMUSCULAR | Status: AC
  Administered 2017-03-01: 4 mg via INTRAVENOUS
  Filled 2017-03-01: qty 2

## 2017-03-01 MED ORDER — SODIUM CHLORIDE 0.9 % IV BOLUS (SEPSIS)
1000.0000 mL | Freq: Once | INTRAVENOUS | Status: AC
  Administered 2017-03-01: 1000 mL via INTRAVENOUS

## 2017-03-01 MED ORDER — MORPHINE SULFATE (PF) 4 MG/ML IV SOLN
4.0000 mg | Freq: Once | INTRAVENOUS | Status: AC
  Administered 2017-03-01: 4 mg via INTRAVENOUS
  Filled 2017-03-01: qty 1

## 2017-03-01 NOTE — ED Triage Notes (Signed)
Pt presents to ED c/o abd pain since 1 hour when bathing her child. Pt describes pain as burning and stabbing and nausea

## 2017-03-02 ENCOUNTER — Encounter: Payer: Self-pay | Admitting: Radiology

## 2017-03-02 ENCOUNTER — Emergency Department: Payer: Self-pay

## 2017-03-02 LAB — HCG, QUANTITATIVE, PREGNANCY: hCG, Beta Chain, Quant, S: <1 m[IU]/mL (ref <5)

## 2017-03-02 MED ORDER — IOPAMIDOL (ISOVUE-300) INJECTION 61%
100.0000 mL | Freq: Once | INTRAVENOUS | Status: AC | PRN
  Administered 2017-03-02: 100 mL via INTRAVENOUS

## 2017-03-02 NOTE — ED Provider Notes (Signed)
[facility] Emergency Department Provider Note  ____________________________________________  Time seen: Approximately 12:42 AM  I have reviewed the triage vital signs and the nursing notes.   HISTORY  Chief Complaint Abdominal Pain   HPI Leslie Sosa is a 36 y.o. female no significant past medical history who presents for evaluation of abdominal pain. Patient reports that she was giving a bath and to her baby an hour prior to arrival when she developed sudden onset of severe abdominal pain that she describes as burning and dull located in the upper central abdominal area, constant and nonradiating. The pain is associated with nausea but no vomiting. No diarrhea, no constipation, no prior abdominal surgeries, no dysuria or hematuria, no fever or chills. Patient denies ever having similar pain. Patient took 400 mg of ibuprofen earlier today for headache but denies daily use of NSAIDs or alcohol. Currently her pain is 10 out of 10.LMP 1 week ago.  History reviewed. No pertinent past medical history.  There are no active problems to display for this patient.   History reviewed. No pertinent surgical history.  Prior to Admission medications   Medication Sig Start Date End Date Taking? Authorizing Provider  EPINEPHrine 0.3 mg/0.3 mL IJ SOAJ injection Inject 0.3 mLs (0.3 mg total) into the muscle once. 04/26/16   Triplett, Cari B, FNP  predniSONE (STERAPRED UNI-PAK 21 TAB) 10 MG (21) TBPK tablet Take 6 tablets on day 1 Take 5 tablets on day 2 Take 4 tablets on day 3 Take 3 tablets on day 4 Take 2 tablets on day 5 Take 1 tablet on day 6 04/26/16   Kem Boroughsriplett, Cari B, FNP    Allergies Patient has no known allergies.  History reviewed. No pertinent family history.  Social History Social History  Substance Use Topics  . Smoking status: Never Smoker  . Smokeless tobacco: Not on file  . Alcohol use No    Review of Systems  Constitutional:  Negative for fever. Eyes: Negative for visual changes. ENT: Negative for sore throat. Neck: No neck pain  Cardiovascular: Negative for chest pain. Respiratory: Negative for shortness of breath. Gastrointestinal: + abdominal pain and nausea. No vomiting or diarrhea. Genitourinary: Negative for dysuria. Musculoskeletal: Negative for back pain. Skin: Negative for rash. Neurological: Negative for headaches, weakness or numbness. Psych: No SI or HI  ____________________________________________   PHYSICAL EXAM:  VITAL SIGNS: ED Triage Vitals  Enc Vitals Group     BP 03/01/17 2240 (!) 154/77     Pulse Rate 03/01/17 2240 91     Resp 03/01/17 2240 18     Temp 03/01/17 2240 98.2 F (36.8 C)     Temp Source 03/01/17 2240 Oral     SpO2 03/01/17 2240 100 %     Weight --      Height 03/01/17 2240 5\' 3"  (1.6 m)     Head Circumference --      Peak Flow --      Pain Score 03/01/17 2355 10     Pain Loc --      Pain Edu? --      Excl. in GC? --     Constitutional: Alert and oriented, looks uncomfortable due to the pain.  HEENT:      Head: Normocephalic and atraumatic.         Eyes: Conjunctivae are normal. Sclera is non-icteric.       Mouth/Throat: Mucous membranes are moist.       Neck: Supple with no  signs of meningismus. Cardiovascular: Regular rate and rhythm. No murmurs, gallops, or rubs. 2+ symmetrical distal pulses are present in all extremities. No JVD. Respiratory: Normal respiratory effort. Lungs are clear to auscultation bilaterally. No wheezes, crackles, or rhonchi.  Gastrointestinal: Soft, ttp over the epigastric and RUQ, negative Murphy's sign, and non distended with positive bowel sounds. No rebound or guarding. Genitourinary: No CVA tenderness. Musculoskeletal: Nontender with normal range of motion in all extremities. No edema, cyanosis, or erythema of extremities. Neurologic: Normal speech and language. Face is symmetric. Moving all extremities. No gross focal  neurologic deficits are appreciated. Skin: Skin is warm, dry and intact. No rash noted. Psychiatric: Mood and affect are normal. Speech and behavior are normal.  ____________________________________________   LABS (all labs ordered are listed, but only abnormal results are displayed)  Labs Reviewed  COMPREHENSIVE METABOLIC PANEL - Abnormal; Notable for the following:       Result Value   Glucose, Bld 106 (*)    All other components within normal limits  URINALYSIS, COMPLETE (UACMP) WITH MICROSCOPIC - Abnormal; Notable for the following:    Color, Urine STRAW (*)    APPearance CLEAR (*)    Hgb urine dipstick SMALL (*)    Ketones, ur 5 (*)    Squamous Epithelial / LPF 0-5 (*)    All other components within normal limits  LIPASE, BLOOD  CBC  TROPONIN I  HCG, QUANTITATIVE, PREGNANCY  POC URINE PREG, ED  POCT PREGNANCY, URINE   ____________________________________________  EKG  ED ECG REPORT I, Nita Sickle, the attending physician, personally viewed and interpreted this ECG.  Normal sinus rhythm, rate of 83, normal intervals, normal axis, no ST elevations or depressions. ____________________________________________  RADIOLOGY  CT a/P: 1. A 1.6 cm right ovarian dominant follicle. 2. Scattered small colonic diverticula without active inflammatory changes. No bowel obstruction. Normal appendix. 3. Mild fatty liver. 4. Probable left renal cyst versus dilated calyx. Correlation with urinalysis recommended to exclude pyelonephritis. No hydronephrosis. ____________________________________________   PROCEDURES  Procedure(s) performed: None Procedures Critical Care performed:  None ____________________________________________   INITIAL IMPRESSION / ASSESSMENT AND PLAN / ED COURSE   36 y.o. female no significant past medical history who presents for evaluation of sudden onset severe burning epigastric and central abdominal pain associated with nausea. Patient is in  obvious distress due to her pain, her vitals are within normal limits, her abdomen shows tenderness to palpation in the epigastric and right upper quadrant with negative Murphy's sign. CBC, CMP, lipase, urinalysis, and urine pregnancy are all negative. Ddx peptic ulcer disease, gastritis, gallbladder pathology, pancreatitis, obstruction, volvulus. Plan for CT abdomen and pelvis, IV fluids, IV Zofran, IV morphine.  Clinical Course as of Mar 03 151  Mon Mar 02, 2017  0149 Patient reports full resolution of her pain. Blood work with no acute findings. CT showing a dominant right ovarian follicle but no cysts, patient has no tenderness in the right lower quadrant, never had pain on the RLQ, and her original pain was burning in the epigastrium/ central abdominal regions therefore no evidence of ovarian torsion. Did explain to the patient that if the pain returned she needs to come back immediately to the emergency room for an ultrasound of her ovaries but at this time without any pain with normal imaging and labs I believe patient is safe for discharge.  [CV]    Clinical Course User Index [CV] Nita Sickle, MD    Pertinent labs & imaging results that were available during my  care of the patient were reviewed by me and considered in my medical decision making (see chart for details).    ____________________________________________   FINAL CLINICAL IMPRESSION(S) / ED DIAGNOSES  Final diagnoses:  Abdominal pain, unspecified abdominal location      NEW MEDICATIONS STARTED DURING THIS VISIT:  New Prescriptions   No medications on file     Note:  This document was prepared using Dragon voice recognition software and may include unintentional dictation errors.    Don Perking, [place], MD 03/02/17 [phone]

## 2017-03-02 NOTE — ED Notes (Signed)
Pt discharged to home.  Family member driving.  Discharge instructions reviewed.  Verbalized understanding.  No questions or concerns at this time.  Teach back verified.  Pt in NAD.  No items left in ED.   

## 2020-06-25 ENCOUNTER — Other Ambulatory Visit: Payer: Self-pay

## 2020-06-25 ENCOUNTER — Emergency Department
Admission: EM | Admit: 2020-06-25 | Discharge: 2020-06-25 | Disposition: A | Discharge status: Home / Self Care | Payer: HRSA Program | Attending: Emergency Medicine | Admitting: Emergency Medicine

## 2020-06-25 DIAGNOSIS — U071 COVID-19: Secondary | ICD-10-CM | POA: Diagnosis not present

## 2020-06-25 DIAGNOSIS — R350 Frequency of micturition: Secondary | ICD-10-CM | POA: Diagnosis not present

## 2020-06-25 DIAGNOSIS — Z79899 Other long term (current) drug therapy: Secondary | ICD-10-CM | POA: Diagnosis not present

## 2020-06-25 DIAGNOSIS — R439 Unspecified disturbances of smell and taste: Secondary | ICD-10-CM | POA: Diagnosis present

## 2020-06-25 DIAGNOSIS — Z20822 Contact with and (suspected) exposure to covid-19: Secondary | ICD-10-CM

## 2020-06-25 LAB — URINALYSIS, COMPLETE (UACMP) WITH MICROSCOPIC
Bacteria, UA: NONE SEEN
Bilirubin Urine: NEGATIVE
Glucose, UA: NEGATIVE mg/dL
Hgb urine dipstick: NEGATIVE
Ketones, ur: NEGATIVE mg/dL
Leukocytes,Ua: NEGATIVE
Nitrite: NEGATIVE
Protein, ur: NEGATIVE mg/dL
Specific Gravity, Urine: 1.016 (ref 1.005–1.030)
pH: 6 (ref 5.0–8.0)

## 2020-06-25 LAB — SARS CORONAVIRUS 2 BY RT PCR (HOSPITAL ORDER, PERFORMED IN ~~LOC~~ HOSPITAL LAB): SARS Coronavirus 2: POSITIVE — AB

## 2020-06-25 NOTE — ED Provider Notes (Signed)
[facility] Emergency Department Provider Note   ____________________________________________    I have reviewed the triage vital signs and the nursing notes.   HISTORY  Chief Complaint Leg Pain and Urinary Frequency  Spanish interpreter used   HPI Leslie Sosa is a 39 y.o. female who presents with complaints of body aches x4 days, loss of taste and smell and some urinary frequency.  She is also had frequent chills.  She has not been vaccinated gets COVID-19.  She reports her children have had fatigue and headaches recently/viral symptoms as well.  Denies dysuria.  No nausea vomiting or diarrhea.  Is not take anything for this  No past medical history on file.  There are no problems to display for this patient.   No past surgical history on file.  Prior to Admission medications   Medication Sig Start Date End Date Taking? Authorizing Provider  EPINEPHrine 0.3 mg/0.3 mL IJ SOAJ injection Inject 0.3 mLs (0.3 mg total) into the muscle once. 04/26/16   Triplett, Cari B, FNP  predniSONE (STERAPRED UNI-PAK 21 TAB) 10 MG (21) TBPK tablet Take 6 tablets on day 1 Take 5 tablets on day 2 Take 4 tablets on day 3 Take 3 tablets on day 4 Take 2 tablets on day 5 Take 1 tablet on day 6 04/26/16   Kem Boroughs B, FNP     Allergies Patient has no known allergies.  No family history on file.  Social History Social History   Tobacco Use  . Smoking status: Never Smoker  Substance Use Topics  . Alcohol use: No  . Drug use: No    Review of Systems  Constitutional: Chills as above Eyes: No visual changes.  ENT: No sore throat. Cardiovascular: Denies chest pain. Respiratory: Denies shortness of breath.  No cough Gastrointestinal: As above Genitourinary: As above Musculoskeletal: Myalgias as above Skin: Negative for rash. Neurological: Negative for headaches   ____________________________________________   PHYSICAL EXAM:  VITAL  SIGNS: ED Triage Vitals  Enc Vitals Group     BP 06/25/20 0441 136/77     Pulse Rate 06/25/20 0441 98     Resp 06/25/20 0441 18     Temp 06/25/20 0441 99.2 F (37.3 C)     Temp Source 06/25/20 0441 Oral     SpO2 06/25/20 0441 99 %     Weight 06/25/20 0442 85.7 kg (189 lb)     Height --      Head Circumference --      Peak Flow --      Pain Score 06/25/20 0442 9     Pain Loc --      Pain Edu? --      Excl. in GC? --     Constitutional: Alert and oriented.   Nose: No congestion/rhinnorhea. Mouth/Throat: Mucous membranes are moist.   Neck:  Painless ROM Cardiovascular: Normal rate, regular rhythm.  Good peripheral circulation. Respiratory: Normal respiratory effort.  No retractions.  Gastrointestinal: Soft and nontender. No distention.  No CVA tenderness.  Musculoskeletal: No lower extremity tenderness nor edema.   Neurologic:  Normal speech and language. No gross focal neurologic deficits are appreciated.  Skin:  Skin is warm, dry and intact. No rash noted. Psychiatric: Mood and affect are normal. Speech and behavior are normal.  ____________________________________________   LABS (all labs ordered are listed, but only abnormal results are displayed)  Labs Reviewed  URINALYSIS, COMPLETE (UACMP) WITH MICROSCOPIC - Abnormal; Notable for the following components:  Result Value   Color, Urine YELLOW (*)    APPearance CLEAR (*)    All other components within normal limits  URINE CULTURE  SARS CORONAVIRUS 2 BY RT PCR (HOSPITAL ORDER, PERFORMED IN [facility])  POC URINE PREG, ED   ____________________________________________  EKG  None ____________________________________________  RADIOLOGY  None ____________________________________________   PROCEDURES  Procedure(s) performed: No  Procedures   Critical Care performed: No ____________________________________________   INITIAL IMPRESSION / ASSESSMENT AND PLAN / ED COURSE  Pertinent  labs & imaging results that were available during my care of the patient were reviewed by me and considered in my medical decision making (see chart for details).  Patient well-appearing in no acute distress, vital signs are reassuring.  Afebrile here in the emergency department.  Symptoms are quite concerning for COVID-19 infection.  Patient is unvaccinated.  Domination of myalgias, loss of taste and smell and chills is very suspicious.  Will obtain Covid swab.  Urinalysis is reassuring, no evidence of UTI, will obtain culture    ____________________________________________   FINAL CLINICAL IMPRESSION(S) / ED DIAGNOSES  Final diagnoses:  Suspected COVID-19 virus infection        Note:  This document was prepared using Dragon voice recognition software and may include unintentional dictation errors.   Jene Every, MD 06/25/20 1044

## 2020-06-25 NOTE — ED Triage Notes (Signed)
[employer] interpreter [phone] pt reports she has had urinary sx of frequency for the last 4 days. Pt also co pain to both legs. Pt states she had amoxicillin that she has been taking with no relief.

## 2020-06-25 NOTE — ED Notes (Signed)
Interpreter requested 

## 2020-06-26 ENCOUNTER — Other Ambulatory Visit: Payer: Self-pay | Admitting: Nurse Practitioner

## 2020-06-26 ENCOUNTER — Telehealth: Payer: Self-pay | Admitting: Nurse Practitioner

## 2020-06-26 DIAGNOSIS — I10 Essential (primary) hypertension: Secondary | ICD-10-CM

## 2020-06-26 DIAGNOSIS — U071 COVID-19: Secondary | ICD-10-CM

## 2020-06-26 DIAGNOSIS — Z6825 Body mass index (BMI) 25.0-25.9, adult: Secondary | ICD-10-CM

## 2020-06-26 NOTE — Telephone Encounter (Signed)
I connected by phone with Leslie Sosa on 06/26/2020 at 11:51 AM to discuss the potential use of a new treatment for mild to moderate COVID-19 viral infection in non-hospitalized patients. Interpreter services were used for this call  This patient is a 39 y.o. female that meets the FDA criteria for Emergency Use Authorization of COVID monoclonal antibody casirivimab/imdevimab.  Has a (+) direct SARS-CoV-2 viral test result  Has mild or moderate COVID-19   Is NOT hospitalized due to COVID-19  Is within 10 days of symptom onset  Has at least one of the high risk factor(s) for progression to severe COVID-19 and/or hospitalization as defined in EUA.  Specific high risk criteria : BMI > 25 and Other high risk medical condition per CDC:  Latina   I have spoken and communicated the following to the patient or parent/caregiver regarding COVID monoclonal antibody treatment:  1. FDA has authorized the emergency use for the treatment of mild to moderate COVID-19 in adults and pediatric patients with positive results of direct SARS-CoV-2 viral testing who are 36 years of age and older weighing at least 40 kg, and who are at high risk for progressing to severe COVID-19 and/or hospitalization.  2. The significant known and potential risks and benefits of COVID monoclonal antibody, and the extent to which such potential risks and benefits are unknown.  3. Information on available alternative treatments and the risks and benefits of those alternatives, including clinical trials.  4. Patients treated with COVID monoclonal antibody should continue to self-isolate and use infection control measures (e.g., wear mask, isolate, social distance, avoid sharing personal items, clean and disinfect high touch surfaces, and frequent handwashing) according to CDC guidelines.   5. The patient or parent/caregiver has the option to accept or refuse COVID monoclonal antibody treatment.  After reviewing  this information with the patient, The patient agreed to proceed with receiving casirivimab\imdevimab infusion and will be provided a copy of the Fact sheet prior to receiving the infusion. Sung Amabile. Judi Jaffe 06/26/2020 11:51 AM

## 2020-06-27 ENCOUNTER — Ambulatory Visit (HOSPITAL_COMMUNITY): Payer: Self-pay

## 2020-06-27 ENCOUNTER — Telehealth: Payer: Self-pay | Admitting: Infectious Diseases

## 2020-06-27 LAB — URINE CULTURE: Culture: <10000 — AB

## 2020-06-27 NOTE — Telephone Encounter (Signed)
Will reschedule her tomorrow morning as she was not able to make her appointment today. Her husband will bring her.

## 2020-06-28 ENCOUNTER — Ambulatory Visit (HOSPITAL_COMMUNITY)
Admission: RE | Admit: 2020-06-28 | Discharge: 2020-06-28 | Disposition: A | Discharge status: Home / Self Care | Payer: HRSA Program | Source: Ambulatory Visit | Attending: Pulmonary Disease | Admitting: Pulmonary Disease

## 2020-06-28 DIAGNOSIS — Z6825 Body mass index (BMI) 25.0-25.9, adult: Secondary | ICD-10-CM | POA: Diagnosis present

## 2020-06-28 DIAGNOSIS — E663 Overweight: Secondary | ICD-10-CM

## 2020-06-28 DIAGNOSIS — I10 Essential (primary) hypertension: Secondary | ICD-10-CM | POA: Insufficient documentation

## 2020-06-28 DIAGNOSIS — U071 COVID-19: Secondary | ICD-10-CM | POA: Diagnosis not present

## 2020-06-28 MED ORDER — SODIUM CHLORIDE 0.9 % IV SOLN
1200.0000 mg | Freq: Once | INTRAVENOUS | Status: AC
  Administered 2020-06-28: 1200 mg via INTRAVENOUS

## 2020-06-28 MED ORDER — SODIUM CHLORIDE 0.9 % IV SOLN: INTRAVENOUS | Status: DC | PRN

## 2020-06-28 MED ORDER — EPINEPHRINE 0.3 MG/0.3ML IJ SOAJ: 0.3000 mg | Freq: Once | INTRAMUSCULAR | Status: DC | PRN

## 2020-06-28 MED ORDER — FAMOTIDINE IN NACL 20-0.9 MG/50ML-% IV SOLN: 20.0000 mg | Freq: Once | INTRAVENOUS | Status: DC | PRN

## 2020-06-28 MED ORDER — DIPHENHYDRAMINE HCL 50 MG/ML IJ SOLN: 50.0000 mg | Freq: Once | INTRAMUSCULAR | Status: DC | PRN

## 2020-06-28 MED ORDER — METHYLPREDNISOLONE SODIUM SUCC 125 MG IJ SOLR: 125.0000 mg | Freq: Once | INTRAMUSCULAR | Status: DC | PRN

## 2020-06-28 MED ORDER — ALBUTEROL SULFATE HFA 108 (90 BASE) MCG/ACT IN AERS: 2.0000 | INHALATION_SPRAY | Freq: Once | RESPIRATORY_TRACT | Status: DC | PRN

## 2020-06-28 NOTE — Discharge Instructions (Signed)
COVID-19 COVID-19 El COVID-19 es una infeccin respiratoria causada por un virus llamado coronavirus tipo 2 causante del sndrome respiratorio agudo grave (SARS-CoV-2). La enfermedad tambin se conoce como enfermedad por coronavirus o nuevo coronavirus. En algunas personas, el virus puede no ocasionar sntomas. En otras, puede producir una infeccin grave. La infeccin puede empeorar rpidamente y causar complicaciones, como:  Neumona o infeccin en los pulmones.  Sndrome de dificultad respiratoria aguda o SDRA. Es una afeccin que se caracteriza por la acumulacin de lquido en los pulmones, que impide que los pulmones se llenen de aire y pasen oxgeno a la sangre.  Insuficiencia respiratoria aguda. Es una afeccin que se caracteriza porque no pasa suficiente oxgeno de los pulmones al cuerpo o porque el dixido de carbono no pasa de los pulmones hacia afuera del cuerpo.  Sepsis o choque sptico. Se trata de una reaccin grave del cuerpo ante una infeccin.  Problemas de coagulacin.  Infecciones secundarias debido a bacterias u hongos.  Falla de rganos. Ocurre cuando los rganos del cuerpo dejan de funcionar. El virus que causa el COVID-19 es contagioso. Esto significa que puede transmitirse de una persona a otra a travs de las gotitas de saliva de la tos y de los estornudos (secreciones respiratorias). Cules son las causas? Esta enfermedad es causada por un virus. Usted puede contagiarse con este virus:  Al inspirar las gotitas de una persona infectada. Las gotitas pueden diseminarse cuando una persona respira, habla, canta, tose o estornuda.  Al tocar algo, como una mesa o el picaportes de una puerta, que estuvo expuesto al virus (contaminado) y luego tocarse la boca, nariz o los ojos. Qu incrementa el riesgo? Riesgo de infeccin Es ms probable que se infecte con este virus si:  Se encuentra a una distancia menor a 6 pies (2 metros) de una persona con COVID-19.  Cuida o  vive con una persona infectada con COVID-19.  Pasa tiempo en espacios interiores repletos de gente o vive en viviendas compartidas. Riesgo de enfermedad grave Es ms probable que se enferme gravemente por el virus si:  Tiene 50aos o ms. Cuanto mayor sea su edad, mayor ser el riesgo de tener una enfermedad grave.  Vive en un hogar de ancianos o centro de atencin a largo plazo.  Tiene cncer.  Tiene una enfermedad prolongada (crnica), como las siguientes: ? Enfermedad pulmonar crnica, que incluye la enfermedad pulmonar obstructiva crnica o asma. ? Una enfermedad crnica que disminuye la capacidad del cuerpo para combatir las infecciones (immunocomprometido). ? Enfermedad cardaca, que incluye insuficiencia cardaca, una afeccin que se caracteriza porque las arterias que llegan al corazn se estrechan u obstruyen (arteriopata coronaria) o una enfermedad que provoca que el msculo cardaco se engrose, se debilite o endurezca (miocardiopata). ? Diabetes. ? Enfermedad renal crnica. ? Anemia drepanoctica, una enfermedad que se caracteriza porque los glbulos rojos tienen una forma anormal de "hoz". ? Enfermedad heptica.  Es obeso. Cules son los signos o sntomas? Los sntomas de esta afeccin pueden ser de leves a graves. Los sntomas pueden aparecer en el trmino de 2 a 14 das despus de haber estado expuesto al virus. Incluyen los siguientes:  Fiebre o escalofros.  Tos.  Dificultad para respirar.  Dolores de cabeza, dolores en el cuerpo o dolores musculares.  Secrecin o congestin nasal.  Dolor de garganta.  Nueva prdida del sentido del gusto o del olfato. Algunas personas tambin pueden tener problemas estomacales, como nuseas, vmitos o diarrea. Es posible que otras personas no tengan sntomas de COVID-19.   Cmo se diagnostica? Esta afeccin se puede diagnosticar en funcin de lo siguiente:  Sus signos y sntomas, especialmente si: ? Vive en una zona  donde hay un brote de COVID-19. ? Viaj recientemente a una zona donde el virus es frecuente. ? Cuida o vive con una persona a quien se le diagnostic COVID-19. ? Estuvo expuesto a una persona a la que se le diagnostic COVID-19.  Un examen fsico.  Anlisis de laboratorio que pueden incluir: ? Tomar una muestra de lquido de la parte posterior de la nariz y la garganta (lquido nasofarngeo), la nariz o la garganta, con un hisopo. ? Una muestra de mucosidad de los pulmones (esputo). ? Anlisis de sangre.  Los estudios de diagnstico por imgenes pueden incluir radiografas, exploracin por tomografa computarizada (TC) o ecografa. Cmo se trata? En este momento, no hay ningn medicamento para tratar el COVID-19. Los medicamentos para tratar otras enfermedades se usan a modo de ensayo para comprobar si son eficaces contra el COVID-19. El mdico le informar sobre las maneras de tratar los sntomas. En la mayora de las personas, la infeccin es leve y puede controlarse en el hogar con reposo, lquidos y medicamentos de venta libre. El tratamiento para una infeccin grave suele realizarse en la unidad de cuidados intensivos (UCI) de un hospital. Puede incluir uno o ms de los siguientes. Estos tratamientos se administran hasta que los sntomas mejoran.  Recibir lquidos y medicamentos a travs de una va intravenosa.  Oxgeno complementario. Para administrar oxgeno extra, se utiliza un tubo en la nariz, una mascarilla o una campana de oxgeno.  Colocarlo para que se recueste boca abajo (decbito prono). Esto facilita el ingreso de oxgeno a los pulmones.  Uso continuo de una mquina de presin positiva de las vas areas (CPAP) o de presin positiva de las vas areas de dos niveles (BPAP). Este tratamiento utiliza una presin de aire leve para mantener las vas respiratorias abiertas. Un tubo conectado a un motor administra oxgeno al cuerpo.  Respirador. Este tratamiento mueve el aire  dentro y fuera de los pulmones mediante el uso de un tubo que se coloca en la trquea.  Traqueostoma. En este procedimiento se hace un orificio en el cuello para insertar un tubo de respiracin.  Oxigenacin por membrana extracorprea (OMEC). En este procedimiento, los pulmones tienen la posibilidad de recuperarse al asumir las funciones del corazn y los pulmones. Suministra oxgeno al cuerpo y elimina el dixido de carbono. Siga estas instrucciones en su casa: Estilo de vida  Si est enfermo, qudese en su casa, excepto para obtener atencin mdica. El mdico le indicar cunto tiempo debe quedarse en casa. Llame al mdico antes de buscar atencin mdica.  Haga reposo en su casa como se lo haya indicado el mdico.  No consuma ningn producto que contenga nicotina o tabaco, como cigarrillos, cigarrillos electrnicos y tabaco de mascar. Si necesita ayuda para dejar de fumar, consulte al mdico.  Retome sus actividades normales segn lo indicado por el mdico. Pregntele al mdico qu actividades son seguras para usted. Instrucciones generales  Use los medicamentos de venta libre y los recetados solamente como se lo haya indicado el mdico.  Beba suficiente lquido como para mantener la orina de color amarillo plido.  Concurra a todas las visitas de seguimiento como se lo haya indicado el mdico. Esto es importante. Cmo se evita?  No hay ninguna vacuna que ayude a prevenir la infeccin por COVID-19. Sin embargo, hay medidas que puede tomar para protegerse y proteger a   otras personas de este virus. Para protegerse:   No viaje a zonas donde el COVID-19 sea un riesgo. Las zonas donde se informa la presencia del COVID-19 cambian con frecuencia. Para identificar las zonas de alto riesgo y las restricciones de viaje, consulte el sitio web de viajes de los Centers for Disease Control and Prevention (CDC) (Centros para el Control y la Prevencin de Enfermedades):  wwwnc.cdc.gov/travel/notices  Si vive o debe viajar a una zona donde el COVID-19 es un riesgo, tome precauciones para evitar infecciones. ? Aljese de las personas enfermas. ? Lvese las manos frecuentemente con agua y jabn durante 20segundos. Use desinfectante para manos con alcohol si no dispone de agua y jabn. ? Evite tocarse la boca, la cara, los ojos o la nariz. ? Evite salir de su casa, siga las indicaciones de su estado y de las autoridades sanitarias locales. ? Si debe salir de su casa, use un barbijo de tela o una mascarilla facial. Asegrese de que le cubra la nariz y la boca. ? Evite los espacios interiores repletos de gente. Mantenga una distancia de al menos 6 pies (2 metros) de otras personas. ? Desinfecte los objetos y las superficies que se tocan con frecuencia todos los das. Pueden incluir:  Encimeras y mesas.  Picaportes e interruptores de luz.  Lavabos, fregaderos y grifos.  Aparatos electrnicos tales como telfonos, controles remotos, teclados, computadoras y tabletas. Cmo proteger a los dems: Si tiene sntomas de COVID-19, tome medidas para evitar que el virus se propague a otras personas.  Si cree que tiene una infeccin por COVID-19, comunquese de inmediato con su mdico. Informe al equipo de atencin mdica que cree que puede tener una infeccin por el COVID-19.  Qudese en su casa. Salga de su casa solo para buscar atencin mdica. No utilice el transporte pblico.  No viaje mientras est enfermo.  Lvese las manos frecuentemente con agua y jabn durante 20segundos. Usar desinfectante para manos con alcohol si no dispone de agua y jabn.  Mantngase alejado de quienes vivan con usted. Permita que los miembros de la familia sanos cuiden a los nios y las mascotas, si es posible. Si tiene que cuidar a los nios o las mascotas, lvese las manos con frecuencia y use un barbijo. Si es posible, permanezca en su habitacin, separado de los dems. Utilice un  bao diferente.  Asegrese de que todas las personas que viven en su casa se laven bien las manos y con frecuencia.  Tosa o estornude en un pauelo de papel o sobre su manga o codo. No tosa o estornude al aire ni se cubra la boca o la nariz con la mano.  Use un barbijo de tela o una mascarilla facial. Asegrese de que le cubra la nariz y la boca. Dnde buscar ms informacin  Centers for Disease Control and Prevention (Centros para el Control y la Prevencin de Enfermedades): www.cdc.gov/coronavirus/2019-ncov/index.html  World Health Organization (Organizacin Mundial de la Salud): www.who.int/health-topics/coronavirus Comunquese con un mdico si:  Vive o ha viajado a una zona donde el COVID-19 es un riesgo y tiene sntomas de infeccin.  Ha tenido contacto con alguien que tiene COVID-19 y usted tiene sntomas de infeccin. Solicite ayuda inmediatamente si:  Tiene dificultad para respirar.  Siente dolor u opresin en el pecho.  Experimenta confusin.  Tiene las uas de los dedos y los labios de color azulado.  Tiene dificultad para despertarse.  Los sntomas empeoran. Estos sntomas pueden representar un problema grave que constituye una emergencia. No espere   a ver si los sntomas desaparecen. Solicite atencin mdica de inmediato. Comunquese con el servicio de emergencias de su localidad (911 en los [place]). No conduzca por sus propios medios [employer] hospital. Informe al personal mdico de emergencias si cree que tiene COVID-19. Resumen  El COVID-19 es una infeccin respiratoria causada por un virus. Tambin se conoce como enfermedad por coronavirus o nuevo coronavirus. Puede causar infecciones graves, como neumona, sndrome de dificultad respiratoria aguda, insuficiencia respiratoria aguda o sepsis.  El virus que causa el COVID-19 es contagioso. Esto significa que puede transmitirse de [place] persona a otra a travs de las gotitas que se despiden al respirar, Heritage manager,  cantar, toser y Engineering geologist.  Es ms probable que desarrolle una enfermedad grave si tiene 50 aos o ms, tiene el sistema inmunitario dbil, vive en un hogar de ancianos o tiene una enfermedad crnica.  No hay ningn medicamento para tratar el COVID-19. El mdico le informar sobre las maneras de tratar los sntomas.  Tome medidas para protegerse y Conservator, museum/gallery a los Merchandiser, retail las infecciones. Lvese las manos con frecuencia y desinfecte los objetos y las superficies que se tocan con frecuencia todos [place]. Mantngase alejado de las personas que estn enfermas y use un barbijo si est enfermo. Esta informacin no tiene Theme park manager el consejo del mdico. Asegrese de hacerle al mdico cualquier pregunta que tenga. Document Revised: 07/28/2019 Document Reviewed: 11/20/2018 Elsevier Patient Education  2020 Elsevier Inc.  COVID-19: Cmo protegerse y proteger a los dems COVID-19: How to Protect Yourself and Others Sepa cmo se propaga  Actualmente, no existe ninguna vacuna para prevenir la enfermedad por coronavirus 2019 (COVID-19).  La mejor forma de prevenir la enfermedad es evitar exponerse a este virus.  Se cree que el virus se transmite principalmente de [place] persona a [place]. ? [employer] que estn en contacto directo entre s (a una distancia inferior a 6 pies [1.18m]). ? A travs de las gotitas respiratorias producidas cuando una persona infectada tose, estornuda o habla. ? Estas gotitas pueden caer en la boca o en la nariz de las personas que estn cerca o pueden ser [employer] pulmones. ? El COVID-19 puede ser transmitido por personas que no presentan sntomas. Lo que todos deben hacer [employer] las manos con frecuencia  Lvese las manos con frecuencia con agua y jabn durante al menos 20 segundos, especialmente despus de Oceanographer en un lugar pblico o despus de sonarse la nariz, toser o estornudar.  Si no dispone de [place] y [place], use un desinfectante  de manos que contenga al menos un 60% de alcohol. Cubra todas las superficies de las manos y frtelas hasta que se sientan secas.  No se toque los ojos, la nariz y la boca sin antes lavarse las manos. Evite el contacto cercano  Limite el contacto directo con otras personas tanto como sea posible.  Evite el contacto cercano con personas que estn enfermas.  Establezca distancia entre usted y [employer]. ? Recuerde que [employer] no tienen sntomas pueden transmitir el virus. ? Esto es Software engineer para las personas que tienen ms riesgo de [email or web address] Cbrase la boca y la nariz con una mascarilla cuando est cerca de otras personas  Puede transmitir el COVID-19 a otras personas aunque no se sienta enfermo.  Todas las Statistician en lugares pblicos y cuando estn con otras que no vivan en su casa, especialmente cuando el distanciamiento social sea difcil de [place]. ? Las  mascarillas no deben colocarse a nios menores de 2 aos de [address], a las personas que tienen problemas respiratorios o que estn inconscientes, incapacitadas o que por algn motivo no puedan quitarse la mascarilla sin [place].  El propsito de Nurse, adult es proteger a Economist en caso de que usted est infectado.  NO utilice las [employer] a los trabajadores de [employer].  Contine manteniendo una distancia aproximada de 6 pies (1.65m) entre usted y [employer]. La mascarilla no reemplaza el distanciamiento social. Cbrase al toser y estornudar  Al toser o al estornudar, siempre cbrase la boca y la nariz con un pauelo descartable o use el interior del codo.  Deseche los pauelos descartables usados en la basura.  Inmediatamente, lvese las manos con agua y [place] durante al menos 20segundos. Si no dispone de agua y [place], [employer] con un desinfectante de  manos que contenga al menos un 60% de alcohol. Limpie y desinfecte  [place]pie Y desinfecte las superficies que se tocan con frecuencia todos los [address]  [address]. Esto incluye mesas, picaportes, interruptores de luz, encimeras, mangos, escritorios, telfonos, teclados, inodoros, grifos y lavabos. [email or web address]  Si las superficies estn sucias, lmpielas: Use detergente o jabn y agua antes de la desinfeccin.  Luego, use un desinfectante domstico. Puede consultar una lista de los desinfectantes domsticos registrados en Financial controller (EPA) (Agencia de Proteccin Ambiental) aqu. [email or web address] 06/08/2019 Esta informacin no tiene Theme park manager el consejo del mdico. Asegrese de hacerle al mdico cualquier pregunta que tenga. Document Revised: 06/22/2019 Document Reviewed: 04/15/2019 Elsevier Patient Education  2020 [employer].  What types of side effects do monoclonal antibody drugs cause?  Common side effects  In general, the more common side effects caused by monoclonal antibody drugs include: . Allergic reactions, such as hives or itching . Flu-like signs and symptoms, including chills, fatigue, fever, and muscle aches and pains . Nausea, vomiting . Diarrhea . Skin rashes . Low blood pressure   The CDC is recommending patients who receive monoclonal antibody treatments wait at least 90 days before being vaccinated.  Currently, there are no data on the safety and efficacy of mRNA COVID-19 vaccines in persons who received monoclonal antibodies or convalescent plasma as part of COVID-19 treatment. Based on the estimated half-life of such therapies as well as evidence suggesting that reinfection is uncommon in the 90 days after initial infection, vaccination should be deferred for at least 90 days, as a precautionary measure until additional information becomes available, to avoid interference of  the antibody treatment with vaccine-induced immune responses.

## 2020-06-28 NOTE — Progress Notes (Signed)
  Diagnosis: COVID-19  Physician: Patrick Wright, MD  Procedure: [facility] Med: casirivimab\imdevimab infusion - Provided patient with casirivimab\imdevimab fact sheet for patients, parents and caregivers prior to infusion.  Complications: No immediate complications noted.  Discharge: Discharged home   Audreyanna Butkiewicz N Jaysha Lasure 06/28/2020  

## 2023-04-26 ENCOUNTER — Emergency Department
Admission: EM | Admit: 2023-04-26 | Discharge: 2023-04-26 | Disposition: A | Discharge status: Home / Self Care | Payer: Medicaid Other | Attending: Emergency Medicine | Admitting: Emergency Medicine

## 2023-04-26 ENCOUNTER — Encounter: Payer: Self-pay | Admitting: Emergency Medicine

## 2023-04-26 ENCOUNTER — Emergency Department: Payer: Medicaid Other

## 2023-04-26 ENCOUNTER — Other Ambulatory Visit: Payer: Self-pay

## 2023-04-26 DIAGNOSIS — Z3A12 12 weeks gestation of pregnancy: Secondary | ICD-10-CM | POA: Insufficient documentation

## 2023-04-26 DIAGNOSIS — O208 Other hemorrhage in early pregnancy: Secondary | ICD-10-CM | POA: Insufficient documentation

## 2023-04-26 DIAGNOSIS — O4690 Antepartum hemorrhage, unspecified, unspecified trimester: Secondary | ICD-10-CM

## 2023-04-26 DIAGNOSIS — O26891 Other specified pregnancy related conditions, first trimester: Secondary | ICD-10-CM | POA: Insufficient documentation

## 2023-04-26 LAB — CBC WITH DIFFERENTIAL/PLATELET
Abs Immature Granulocytes: 0.02 10*3/uL (ref 0.00–0.07)
Basophils Absolute: 0 10*3/uL (ref 0.0–0.1)
Basophils Relative: 1 %
Eosinophils Absolute: 0.1 10*3/uL (ref 0.0–0.5)
Eosinophils Relative: 2 %
HCT: 39.9 % (ref 36.0–46.0)
Hemoglobin: 13.3 g/dL (ref 12.0–15.0)
Immature Granulocytes: 0 %
Lymphocytes Relative: 34 %
Lymphs Abs: 2.4 10*3/uL (ref 0.7–4.0)
MCH: 29.4 pg (ref 26.0–34.0)
MCHC: 33.3 g/dL (ref 30.0–36.0)
MCV: 88.3 fL (ref 80.0–100.0)
Monocytes Absolute: 0.6 10*3/uL (ref 0.1–1.0)
Monocytes Relative: 8 %
Neutro Abs: 3.9 10*3/uL (ref 1.7–7.7)
Neutrophils Relative %: 55 %
Platelets: 249 10*3/uL (ref 150–400)
RBC: 4.52 MIL/uL (ref 3.87–5.11)
RDW: 12.9 % (ref 11.5–15.5)
WBC: 7.1 10*3/uL (ref 4.0–10.5)
nRBC: 0 % (ref 0.0–0.2)

## 2023-04-26 LAB — URINALYSIS, ROUTINE W REFLEX MICROSCOPIC
Bilirubin Urine: NEGATIVE
Glucose, UA: NEGATIVE mg/dL
Ketones, ur: NEGATIVE mg/dL
Leukocytes,Ua: NEGATIVE
Nitrite: NEGATIVE
Protein, ur: NEGATIVE mg/dL
Specific Gravity, Urine: 1.01 (ref 1.005–1.030)
pH: 7 (ref 5.0–8.0)

## 2023-04-26 LAB — COMPREHENSIVE METABOLIC PANEL
ALT: 37 U/L (ref 0–44)
AST: 28 U/L (ref 15–41)
Albumin: 4.3 g/dL (ref 3.5–5.0)
Alkaline Phosphatase: 57 U/L (ref 38–126)
Anion gap: 7 (ref 5–15)
BUN: 15 mg/dL (ref 6–20)
CO2: 24 mmol/L (ref 22–32)
Calcium: 8.5 mg/dL — ABNORMAL LOW (ref 8.9–10.3)
Chloride: 107 mmol/L (ref 98–111)
Creatinine, Ser: 0.62 mg/dL (ref 0.44–1.00)
GFR, Estimated: >60 mL/min (ref >60)
Glucose, Bld: 109 mg/dL — ABNORMAL HIGH (ref 70–99)
Potassium: 3.5 mmol/L (ref 3.5–5.1)
Sodium: 138 mmol/L (ref 135–145)
Total Bilirubin: 0.3 mg/dL (ref 0.3–1.2)
Total Protein: 7.2 g/dL (ref 6.5–8.1)

## 2023-04-26 LAB — HCG, QUANTITATIVE, PREGNANCY: hCG, Beta Chain, Quant, S: 9661 m[IU]/mL — ABNORMAL HIGH (ref <5)

## 2023-04-26 LAB — POC URINE PREG, ED: Preg Test, Ur: POSITIVE — AB

## 2023-04-26 LAB — ABO/RH: ABO/RH(D): O POS

## 2023-04-26 NOTE — ED Provider Notes (Signed)
[facility] Provider Note    Event Date/Time   First MD Initiated Contact with Patient 04/26/23 2102     (approximate)   History   Vaginal Bleeding   HPI  Leslie Sosa is a 42 y.o. female  with no significant medical history and as listed in EMR presents to the emergency department for evaluation of vaginal bleeding. She believes she is about 3 months pregnant. G5P4. Yesterday she started having vaginal bleeding and pelvic cramping. Bleeding was less today, but is still having the cramping. She denies passing clots. No urinary symptoms. No back pain.      Physical Exam   Triage Vital Signs: ED Triage Vitals [04/26/23 1943]  Encounter Vitals Group     BP (!) 166/100     Systolic BP Percentile      Diastolic BP Percentile      Pulse Rate 84     Resp 18     Temp 98.3 F (36.8 C)     Temp Source Oral     SpO2 96 %     Weight 193 lb (87.5 kg)     Height 5\' 3"  (1.6 m)     Head Circumference      Peak Flow      Pain Score 3     Pain Loc      Pain Education      Exclude from Growth Chart     Most recent vital signs: Vitals:   04/26/23 1943  BP: (!) 166/100  Pulse: 84  Resp: 18  Temp: 98.3 F (36.8 C)  SpO2: 96%    General: Awake, no distress.  CV:  Good peripheral perfusion.  Resp:  Normal effort.  Abd:  No distention. Abdomen is soft. No tenderness with palpation. Other:     ED Results / Procedures / Treatments   Labs (all labs ordered are listed, but only abnormal results are displayed) Labs Reviewed  COMPREHENSIVE METABOLIC PANEL - Abnormal; Notable for the following components:      Result Value   Glucose, Bld 109 (*)    Calcium 8.5 (*)    All other components within normal limits  URINALYSIS, ROUTINE W REFLEX MICROSCOPIC - Abnormal; Notable for the following components:   Color, Urine YELLOW (*)    APPearance CLEAR (*)    Hgb urine dipstick SMALL (*)    Bacteria, UA RARE (*)    All other components within  normal limits  HCG, QUANTITATIVE, PREGNANCY - Abnormal; Notable for the following components:   hCG, Beta Chain, Quant, S 9,661 (*)    All other components within normal limits  POC URINE PREG, ED - Abnormal; Notable for the following components:   Preg Test, Ur Positive (*)    All other components within normal limits  CBC WITH DIFFERENTIAL/PLATELET  ABO/RH     EKG  Not indicated.   RADIOLOGY  Image and radiology report reviewed and interpreted by me. Radiology report consistent with the same.  Ultrasound shows probable early intrauterine gestational sac but no yolk sac, fetal pole or cardiac activity is noted.  PROCEDURES:  Critical Care performed: No  Procedures   MEDICATIONS ORDERED IN ED:  Medications - No data to display   IMPRESSION / MDM / ASSESSMENT AND PLAN / ED COURSE   I have reviewed the triage note.  Differential diagnosis includes, but is not limited to, vaginal bleeding in pregnancy, subchorionic hemorrhage, miscarriage, ectopic pregnancy  Patient's presentation is most consistent with  acute presentation with potential threat to life or bodily function.  42 year old female presenting to the emergency department for treatment and evaluation of pelvic pain and vaginal bleeding.  She believes that she is approximately 3 months pregnant.  She has not yet established care with gynecology.  While awaiting ER room assignment, labs were obtained.  She has a normal CBC, unremarkable CMP, beta hCG is 9661, and she is O positive.  Urinalysis shows small amount hemoglobin and rare bacteria.  Ultrasound shows intrauterine gestational sac but no yolk sac or fetal pole is noted.  Results discussed with the patient.  She was encouraged to have repeat urinalysis and labs in 2 weeks.  The appointment that she has with her gynecologist is not until later in the month of August.  She was advised to call and request an appointment sooner or call OB/GYN as listed on her  discharge papers and see if she can be seen within the next couple weeks.  If not, she was advised to come back to the emergency department.  She was also advised to return to the emergency department if she begins to have heavy vaginal bleeding or increase in pelvic pain.  Video interpreter utilized for assessment and discharge information      FINAL CLINICAL IMPRESSION(S) / ED DIAGNOSES   Final diagnoses:  Vaginal bleeding in pregnancy     Rx / DC Orders   ED Discharge Orders     None        Note:  This document was prepared using Dragon voice recognition software and may include unintentional dictation errors.   Chinita Pester, FNP 04/26/23 2328    Merwyn Katos, MD 04/29/23 [phone]

## 2023-04-26 NOTE — ED Triage Notes (Signed)
Spanish Leslie Sosa #[id number]  Patient c/o vaginal bleeding since yesterday, brown in color.  Patient endorses abdominal cramping. Patient is approx 3 months pregnant.  Patient has not had prenatal care yet, has an appointment August. This is patient's 5th pregnancy.

## 2023-05-20 ENCOUNTER — Other Ambulatory Visit: Payer: Self-pay

## 2023-05-20 DIAGNOSIS — R1030 Lower abdominal pain, unspecified: Secondary | ICD-10-CM | POA: Insufficient documentation

## 2023-05-20 DIAGNOSIS — R7401 Elevation of levels of liver transaminase levels: Secondary | ICD-10-CM | POA: Insufficient documentation

## 2023-05-20 LAB — COMPREHENSIVE METABOLIC PANEL
ALT: 46 U/L — ABNORMAL HIGH (ref 0–44)
AST: 28 U/L (ref 15–41)
Albumin: 3.9 g/dL (ref 3.5–5.0)
Alkaline Phosphatase: 77 U/L (ref 38–126)
Anion gap: 10 (ref 5–15)
BUN: 13 mg/dL (ref 6–20)
CO2: 18 mmol/L — ABNORMAL LOW (ref 22–32)
Calcium: 8.8 mg/dL — ABNORMAL LOW (ref 8.9–10.3)
Chloride: 107 mmol/L (ref 98–111)
Creatinine, Ser: 0.49 mg/dL (ref 0.44–1.00)
GFR, Estimated: >60 mL/min (ref >60)
Glucose, Bld: 105 mg/dL — ABNORMAL HIGH (ref 70–99)
Potassium: 3.8 mmol/L (ref 3.5–5.1)
Sodium: 135 mmol/L (ref 135–145)
Total Bilirubin: 0.7 mg/dL (ref 0.3–1.2)
Total Protein: 7.3 g/dL (ref 6.5–8.1)

## 2023-05-20 LAB — URINALYSIS, ROUTINE W REFLEX MICROSCOPIC
Bilirubin Urine: NEGATIVE
Glucose, UA: NEGATIVE mg/dL
Hgb urine dipstick: NEGATIVE
Ketones, ur: NEGATIVE mg/dL
Leukocytes,Ua: NEGATIVE
Nitrite: NEGATIVE
Protein, ur: NEGATIVE mg/dL
Specific Gravity, Urine: 1.012 (ref 1.005–1.030)
pH: 6 (ref 5.0–8.0)

## 2023-05-20 LAB — CBC
HCT: 40.3 % (ref 36.0–46.0)
Hemoglobin: 13.1 g/dL (ref 12.0–15.0)
MCH: 28.9 pg (ref 26.0–34.0)
MCHC: 32.5 g/dL (ref 30.0–36.0)
MCV: 89 fL (ref 80.0–100.0)
Platelets: 265 10*3/uL (ref 150–400)
RBC: 4.53 MIL/uL (ref 3.87–5.11)
RDW: 12.5 % (ref 11.5–15.5)
WBC: 9.3 10*3/uL (ref 4.0–10.5)
nRBC: 0 % (ref 0.0–0.2)

## 2023-05-20 LAB — LIPASE, BLOOD: Lipase: 31 U/L (ref 11–51)

## 2023-05-20 NOTE — ED Triage Notes (Signed)
Lower abd pain x 1 wk and lower back pain. Reports pain comes and goes. Reports painful urination since yesterday. Reports mild nausea without emesis. Denies diarrhea or fever. Pt ambulatory to triage. Alert and oriented following commands. Breathing unlabored speaking in full sentences. Symmetric chest rise and fall.   Pt now reporting a miscarriage 10 days ago and states the pain feels the same as it did at that time. Denies vaginal bleeding or discharge.

## 2023-05-21 ENCOUNTER — Emergency Department: Payer: Self-pay

## 2023-05-21 ENCOUNTER — Emergency Department
Admission: EM | Admit: 2023-05-21 | Discharge: 2023-05-21 | Disposition: A | Discharge status: Home / Self Care | Payer: Self-pay | Attending: Emergency Medicine | Admitting: Emergency Medicine

## 2023-05-21 DIAGNOSIS — R1030 Lower abdominal pain, unspecified: Secondary | ICD-10-CM

## 2023-05-21 LAB — HCG, QUANTITATIVE, PREGNANCY: hCG, Beta Chain, Quant, S: 3 m[IU]/mL (ref <5)

## 2023-05-21 LAB — PREGNANCY, URINE: Preg Test, Ur: NEGATIVE

## 2023-05-21 MED ORDER — KETOROLAC TROMETHAMINE 30 MG/ML IJ SOLN
30.0000 mg | Freq: Once | INTRAMUSCULAR | Status: AC
  Administered 2023-05-21: 30 mg via INTRAMUSCULAR
  Filled 2023-05-21: qty 1

## 2023-05-21 NOTE — Discharge Instructions (Addendum)
Please take Tylenol and ibuprofen/Advil for your pain.  It is safe to take them together, or to alternate them every few hours.  Take up to 1000mg of Tylenol at a time, up to 4 times per day.  Do not take more than 4000 mg of Tylenol in 24 hours.  For ibuprofen, take 400-600 mg, 3 - 4 times per day.  

## 2023-05-21 NOTE — ED Provider Notes (Signed)
[facility] Provider Note    Event Date/Time   First MD Initiated Contact with Patient 05/21/23 [phone]     (approximate)   History   Abdominal Pain   HPI  Leslie Sosa is a 42 y.o. female who presents to the ED for evaluation of Abdominal Pain   Obese patient presents for evaluation of 1 week of intermittent lower abdominal pain.  She reports pain to her lower abdomen bilaterally that is intermittent in nature over the past week.  Sometimes radiates around to her lumbar back.  Denies any associated symptoms, chest pain.  With further questioning based off triage notes, she does acknowledge 1 episode of dysuria but does not think it is a UTI.  Denies stool changes, emesis, nausea, vaginal bleeding or discharge, fevers.   Reports the pain is more severe, 7 or 8/10 intensity when she came into the ED, but by the time I see her she reports pain has waned significantly and refuses my offer for analgesia.  Physical Exam   Triage Vital Signs: ED Triage Vitals  Encounter Vitals Group     BP 05/20/23 2317 (!) 149/96     Systolic BP Percentile --      Diastolic BP Percentile --      Pulse Rate 05/20/23 2317 85     Resp 05/20/23 2317 18     Temp 05/20/23 2323 98.4 F (36.9 C)     Temp Source 05/20/23 2317 Oral     SpO2 05/20/23 2317 98 %     Weight 05/20/23 2320 190 lb (86.2 kg)     Height 05/20/23 2320 5\' 3"  (1.6 m)     Head Circumference --      Peak Flow --      Pain Score 05/20/23 2318 8     Pain Loc --      Pain Education --      Exclude from Growth Chart --     Most recent vital signs: Vitals:   05/20/23 2323 05/21/23 0317  BP:  129/62  Pulse:  71  Resp:  16  Temp: 98.4 F (36.9 C) 98.1 F (36.7 C)  SpO2:  100%    General: Awake, no distress.  CV:  Good peripheral perfusion.  Resp:  Normal effort.  Abd:  No distention.  Mild and poorly localizing lower abdominal tenderness without peritoneal features.  Upper abdomen is  benign. MSK:  No deformity noted.  Neuro:  No focal deficits appreciated. Other:     ED Results / Procedures / Treatments   Labs (all labs ordered are listed, but only abnormal results are displayed) Labs Reviewed  COMPREHENSIVE METABOLIC PANEL - Abnormal; Notable for the following components:      Result Value   CO2 18 (*)    Glucose, Bld 105 (*)    Calcium 8.8 (*)    ALT 46 (*)    All other components within normal limits  URINALYSIS, ROUTINE W REFLEX MICROSCOPIC - Abnormal; Notable for the following components:   Color, Urine STRAW (*)    APPearance CLEAR (*)    All other components within normal limits  LIPASE, BLOOD  CBC  PREGNANCY, URINE  HCG, QUANTITATIVE, PREGNANCY    EKG   RADIOLOGY CT abdomen/pelvis interpreted by me without evidence of acute pathology  Official radiology report(s): CT ABDOMEN PELVIS WO CONTRAST  Result Date: 05/21/2023 CLINICAL DATA:  Lower abdominal pain. EXAM: CT ABDOMEN AND PELVIS WITHOUT CONTRAST TECHNIQUE: Multidetector CT imaging of  the abdomen and pelvis was performed following the standard protocol without IV contrast. RADIATION DOSE REDUCTION: This exam was performed according to the departmental dose-optimization program which includes automated exposure control, adjustment of the mA and/or kV according to patient size and/or use of iterative reconstruction technique. COMPARISON:  03/02/2017 FINDINGS: Lower chest: Unremarkable. Hepatobiliary: The liver shows diffusely decreased attenuation suggesting fat deposition. No suspicious focal abnormality in the liver on this study without intravenous contrast. There is no evidence for gallstones, gallbladder wall thickening, or pericholecystic fluid. No intrahepatic or extrahepatic biliary dilation. Pancreas: No focal mass lesion. No dilatation of the main duct. No intraparenchymal cyst. No peripancreatic edema. Spleen: No splenomegaly. No suspicious focal mass lesion. Adrenals/Urinary Tract: No  adrenal nodule or mass. 13 mm low-density lesion in the anterior interpolar right kidney was 8 mm previously. This approaches water attenuation and minimal change over relatively long interval is most compatible with benign etiology such as a cyst. No followup imaging is recommended. Punctate nonobstructing stone identified interpolar left kidney. No evidence for hydroureter. The urinary bladder appears normal for the degree of distention. Stomach/Bowel: Stomach is unremarkable. No gastric wall thickening. No evidence of outlet obstruction. Duodenum is normally positioned as is the ligament of Treitz. No small bowel wall thickening. No small bowel dilatation. The terminal ileum is normal. The appendix is normal. No gross colonic mass. No colonic wall thickening. Vascular/Lymphatic: There is mild atherosclerotic calcification of the abdominal aorta without aneurysm. There is no gastrohepatic or hepatoduodenal ligament lymphadenopathy. No retroperitoneal or mesenteric lymphadenopathy. No pelvic sidewall lymphadenopathy. Reproductive: Unremarkable. Other: No intraperitoneal free fluid. Musculoskeletal: No worrisome lytic or sclerotic osseous abnormality. IMPRESSION: 1. No acute findings in the abdomen or pelvis. Specifically, no findings to explain the patient's history of lower abdominal pain. 2. Punctate nonobstructing left renal stone. 3. Hepatic steatosis. 4.  Aortic Atherosclerosis (ICD10-I70.0). Electronically Signed   By: Kennith Center M.D.   On: 05/21/2023 05:06    PROCEDURES and INTERVENTIONS:  Procedures  Medications  ketorolac (TORADOL) 30 MG/ML injection 30 mg (30 mg Intramuscular Given 05/21/23 0456)     IMPRESSION / MDM / ASSESSMENT AND PLAN / ED COURSE  I reviewed the triage vital signs and the nursing notes.  Differential diagnosis includes, but is not limited to, cystitis, appendicitis, IBS, STI or ectopic pregnancy or constipation  {Patient presents with symptoms of an acute illness or  injury that is potentially life-threatening.  Patient presents to the ED for evaluation of 1 week of intermittent lower abdominal pain without evidence of acute pathology and suitable for outpatient management.  She look systemically well and has a reassuring workup.  Normal CBC.  Metabolic panel with marginal elevation of ALT and mild non-anion gap metabolic acidosis.  Negative pregnancy test, normal lipase and urinalysis.  CT without evidence of acute features.  She is suitable for outpatient management, discussed return precautions.      FINAL CLINICAL IMPRESSION(S) / ED DIAGNOSES   Final diagnoses:  Lower abdominal pain     Rx / DC Orders   ED Discharge Orders     None        Note:  This document was prepared using Dragon voice recognition software and may include unintentional dictation errors.   Delton Prairie, MD 05/21/23 [phone]
